# Patient Record
Sex: Male | Born: 1940 | Race: White | Hispanic: No | Marital: Married
Health system: Southern US, Community
[De-identification: ages and names within clinical notes are randomized; demographics above are authoritative.]

## PROBLEM LIST (undated history)

## (undated) DIAGNOSIS — A419 Sepsis, unspecified organism: Secondary | ICD-10-CM

## (undated) DIAGNOSIS — I38 Endocarditis, valve unspecified: Secondary | ICD-10-CM

## (undated) DIAGNOSIS — T826XXA Infection and inflammatory reaction due to cardiac valve prosthesis, initial encounter: Secondary | ICD-10-CM

## (undated) DIAGNOSIS — R6520 Severe sepsis without septic shock: Secondary | ICD-10-CM

## (undated) DIAGNOSIS — J9621 Acute and chronic respiratory failure with hypoxia: Secondary | ICD-10-CM

## (undated) DIAGNOSIS — I482 Chronic atrial fibrillation, unspecified: Secondary | ICD-10-CM

## (undated) DIAGNOSIS — J449 Chronic obstructive pulmonary disease, unspecified: Secondary | ICD-10-CM

## (undated) HISTORY — DX: Endocarditis, valve unspecified: I38

## (undated) HISTORY — DX: Sepsis, unspecified organism: R65.20

## (undated) HISTORY — DX: Acute and chronic respiratory failure with hypoxia: J96.21

## (undated) HISTORY — DX: Chronic atrial fibrillation, unspecified: I48.20

## (undated) HISTORY — DX: Infection and inflammatory reaction due to cardiac valve prosthesis, initial encounter: T82.6XXA

## (undated) HISTORY — DX: Sepsis, unspecified organism: A41.9

## (undated) HISTORY — DX: Chronic obstructive pulmonary disease, unspecified: J44.9

---

## 2019-05-28 ENCOUNTER — Other Ambulatory Visit: Payer: Self-pay | Admitting: Internal Medicine

## 2019-05-28 DIAGNOSIS — A419 Sepsis, unspecified organism: Secondary | ICD-10-CM

## 2019-05-28 DIAGNOSIS — J449 Chronic obstructive pulmonary disease, unspecified: Secondary | ICD-10-CM

## 2019-05-28 DIAGNOSIS — T826XXS Infection and inflammatory reaction due to cardiac valve prosthesis, sequela: Secondary | ICD-10-CM

## 2019-05-28 DIAGNOSIS — I38 Endocarditis, valve unspecified: Secondary | ICD-10-CM

## 2019-05-28 DIAGNOSIS — I482 Chronic atrial fibrillation, unspecified: Secondary | ICD-10-CM | POA: Diagnosis not present

## 2019-05-28 DIAGNOSIS — J9621 Acute and chronic respiratory failure with hypoxia: Secondary | ICD-10-CM | POA: Diagnosis not present

## 2019-05-28 MED ORDER — APIXABAN 5 MG PO TABS
5.00 | ORAL_TABLET | ORAL | Status: DC
Start: 2019-05-27 — End: 2019-05-28

## 2019-05-28 MED ORDER — LINEZOLID 100 MG/5ML PO SUSR
600.00 | ORAL | Status: DC
Start: 2019-05-27 — End: 2019-05-28

## 2019-05-28 MED ORDER — TAMSULOSIN HCL 0.4 MG PO CAPS
0.40 | ORAL_CAPSULE | ORAL | Status: DC
Start: 2019-05-28 — End: 2019-05-28

## 2019-05-28 MED ORDER — LORAZEPAM 1 MG PO TABS
0.50 | ORAL_TABLET | ORAL | Status: DC
Start: ? — End: 2019-05-28

## 2019-05-28 MED ORDER — ARFORMOTEROL TARTRATE 15 MCG/2ML IN NEBU
15.00 | INHALATION_SOLUTION | RESPIRATORY_TRACT | Status: DC
Start: 2019-05-27 — End: 2019-05-28

## 2019-05-28 MED ORDER — DEXTROSE 50 % IV SOLN
25.00 | INTRAVENOUS | Status: DC
Start: ? — End: 2019-05-28

## 2019-05-28 MED ORDER — ACETAMINOPHEN 160 MG/5ML PO SOLN
650.00 | ORAL | Status: DC
Start: ? — End: 2019-05-28

## 2019-05-28 MED ORDER — QUETIAPINE FUMARATE 25 MG PO TABS
25.00 | ORAL_TABLET | ORAL | Status: DC
Start: 2019-05-27 — End: 2019-05-28

## 2019-05-28 MED ORDER — IPRATROPIUM-ALBUTEROL 0.5-2.5 (3) MG/3ML IN SOLN
3.00 | RESPIRATORY_TRACT | Status: DC
Start: ? — End: 2019-05-28

## 2019-05-28 MED ORDER — OXYCODONE HCL 5 MG PO TABS
5.00 | ORAL_TABLET | ORAL | Status: DC
Start: ? — End: 2019-05-28

## 2019-05-28 MED ORDER — INSULIN REGULAR HUMAN 100 UNIT/ML IJ SOLN
0.00 | INTRAMUSCULAR | Status: DC
Start: 2019-05-27 — End: 2019-05-28

## 2019-05-28 MED ORDER — AMIODARONE HCL 200 MG PO TABS
200.00 | ORAL_TABLET | ORAL | Status: DC
Start: 2019-05-28 — End: 2019-05-28

## 2019-05-28 MED ORDER — BUDESONIDE 0.5 MG/2ML IN SUSP
0.50 | RESPIRATORY_TRACT | Status: DC
Start: 2019-05-27 — End: 2019-05-28

## 2019-05-28 MED ORDER — ASPIRIN 81 MG PO CHEW
81.00 | CHEWABLE_TABLET | ORAL | Status: DC
Start: 2019-05-28 — End: 2019-05-28

## 2019-05-28 MED ORDER — MELATONIN 3 MG PO TABS
3.00 | ORAL_TABLET | ORAL | Status: DC
Start: 2019-05-27 — End: 2019-05-28

## 2019-05-28 MED ORDER — FUROSEMIDE 20 MG PO TABS
20.00 | ORAL_TABLET | ORAL | Status: DC
Start: 2019-05-27 — End: 2019-05-28

## 2019-05-28 MED ORDER — ATORVASTATIN CALCIUM 10 MG PO TABS
10.00 | ORAL_TABLET | ORAL | Status: DC
Start: 2019-05-27 — End: 2019-05-28

## 2019-05-28 MED ORDER — CHLORHEXIDINE GLUCONATE 0.12 % MT SOLN
15.00 | OROMUCOSAL | Status: DC
Start: 2019-05-27 — End: 2019-05-28

## 2019-05-28 MED ORDER — LANSOPRAZOLE 30 MG PO TBDD
30.00 | DELAYED_RELEASE_TABLET | ORAL | Status: DC
Start: 2019-05-27 — End: 2019-05-28

## 2019-05-28 MED ORDER — LOPERAMIDE HCL 1 MG/7.5ML PO LIQD
2.00 | ORAL | Status: DC
Start: ? — End: 2019-05-28

## 2019-05-28 MED ORDER — ONDANSETRON HCL 4 MG/2ML IJ SOLN
4.00 | INTRAMUSCULAR | Status: DC
Start: ? — End: 2019-05-28

## 2019-05-28 NOTE — Progress Notes (Signed)
[facility]  Date of Service: 05/28/2019  PULMONARY CONSULT   Troy Day  ZOX:[id number]  DOB: 01/31/1941     Referring Physician: Larena GlassmanAmir Firozvi, MD  HPI: Troy Day is a 78 y.o. male seen for Acute on Chronic Respiratory Failure.  Patient is a very complex patient with a past medical history significant for aortic valve replacement atrial fibrillation COPD hyperlipidemia none sustained ventricular tachycardia hypertension GERD aortic aneurysm with repair hyperlipidemia prostate cancer who [facility] and sepsis.  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  Patient was admitted to the hospital for IV antibiotics and CT surgery also did see the patient.  Patient was taken to the OR because of persistent positive cultures and underwent a sternotomy and redo AVR along with the ascending aortic root replacement.  Complicated postoperatively course patient apparently developed a abdominal pain CT was done which showed segmental bowel thickening surgery consulted and saw the patient with a benign exam refused repeat CTA was done and it showed some distal occlusion and vascular surgery was consulted.  Patient underwent exploration had a SMA embolectomy done with vascular and general surgery evaluating the patient.  His bowels were left open with a wound VAC placed.  Also had sepsis and required pressors at that time.  The patient subsequently failed to come off of the ventilator and ended up with a tracheostomy.  Patient apparently was also taken back to the OR for reexploration and abdominal closure.  Apparently the family had [facility] patient's family achieved a virtual consultation and they expressed that there would be a possibility of  surgical intervention.  Patient was subsequently extubated and required BiPAP status however worsened and ended up being reintubated and at this point tracheostomy was done.  Transferred to our facility for further management and weaning.  Review of Systems:  ROS performed and is unremarkable other than noted above.  Past Medical History: Past Medical History:  Diagnosis Date  . Aortic stenosis 10/2018  Bioprosthetic AV replacment  . Atrial fibrillation with RVR (CMS/HCC) 10/24/2018  . COPD (chronic obstructive pulmonary disease) (CMS/HCC)  USES INHALER  . Dyslipidemia 03/15/2014  . Dysrhythmia  NSVT  . Essential hypertension 03/15/2014  NO BP MEDS - CONTROLLED  . GERD (gastroesophageal reflux disease)  . H/O coronary angioplasty  STENT  . Heart imaging 09/30/2018  ECHO - EF 50-55%  . Heart murmur  . History of aortic aneurysm repair 10/2018  [place] Procedure  . History of tobacco abuse 03/15/2014  . Hyperlipidemia  . Immunizations up to date  . Paroxysmal atrial fibrillation (CMS/HCC) 10/31/2018  . Prostate cancer (CMS/HCC)  15 YEARS AGO - NO CHEMO OR RADIATION   Past Surgical History:  Procedure Laterality Date  . CARDIAC CATHETERIZATION 08/13/2018  . CARDIAC VALVE REPLACEMENT 10/15/2018  AVR  . CORONARIES LHC WITH OR WITHOUT LV N/A 03/30/2016  Procedure: Coronaries LHC with or without LV; Surgeon: Berneice HeinrichFrances Oliver Wood, MD; [facility]; Service: Cardiovascular; Laterality: N/A;  . CORONARIES RHC - NO LHC 08/13/2018  Procedure: Coronaries RHC - No LHC; Surgeon: Rozelle LoganJack Watson Noneman Jr., MD; [facility]; Service: Cardiovascular;;  . CORONARY STENT PLACEMENT  x 2 RCA, LAD  . PROSTATECTOMY  15 years ago - no chemo or radiation  . REPAIR OF ASCENDING  AORTA AND HEMIARCH 10/15/2018  . SUPRAVALVULAR AORTOGRAPHY - ADD ON 08/13/2018  Procedure: Supravalvular Aortography - Add On; Surgeon: Rozelle Logan., MD; Location: [facility] INVASIVE  CARDIOLOGY; Service: Cardiovascular;;     Family History: Non-Contributory to the present illness  Allergies  Reviewed on the [facility]  Medications: Reviewed on Rounds  Physical Exam:  Vitals: Temperature 97.7 pulse 98 respiratory 26 blood pressure 124/60 saturations 95%  Ventilator Settings mode ventilation pressure support FiO2 50% tidal line 358 pressure poor 12 PEEP 7  . General: Comfortable at this time . Eyes: Grossly normal lids, irises & conjunctiva . ENT: grossly tongue is normal . Neck: no obvious mass . Cardiovascular: S1-S2 normal no gallop or rub is noted at this time . Respiratory: No rhonchi no rales are noted . Abdomen: Soft and nontender . Skin: no rash seen on limited exam . Musculoskeletal: not rigid . Psychiatric:unable to assess . Neurologic: no seizure no involuntary movements         Labs on Admission:  White count 4.5 hemoglobin 7.9 hematocrit 28.0 platelet count 225 Sodium 139 potassium 4.6 BUN 17 creatinine 0.4  Radiological Exams on Admission: *EXAM: Chest One View  INDICATION: Respiratory Arrest/Failure.  TECHNIQUE: Single frontal view of the chest.  COMPARISON: None  FINDINGS/IMPRESSION:  Limited by underpenetration, portable technique and low lung volumes.  Tracheostomy tube with tip at the clavicular heads. Median sternotomy wires. Probable mild-to-moderate cardiomegaly. Residual oral contrast in left colon.  Hazy opacities at the costophrenic angles which may relate to small bilateral pleural effusions, left greater than right.  Moderate diffuse heterogeneous pulmonary opacities are nonspecific and could relate to pulmonary edema and/or pneumonia including atypical pneumonias. Other infiltrative pulmonary process not excluded.     Electronically Signed by: Gracy Bruins, MD, [facility] Radiology Electronically Signed on: 05/27/2019 10:10 PM     Patient:   Troy Day, Troy Day   Med Rec#:   [id number]        DOB;  Age:   [birth year] 78y yr.  Proc Date:  05/26/2019       Account#:   [id number]       Height:    177 cm / 69.7 in Accession#:  [id number]     Weight:    88.4 kg / 194.8 lbs Sex:     M           BSA:     2.06 Pt. Type:   Inpatient Exam Loc:   Portable  Reading:   Thornton Papas, MD Referring:  Altamese Cabal Sonographer: Brennan Bailey ______________________________________________________________________  Transthoracic Echocardiogram  CPT Code(s): * Echocardiogram Comp with Color Flow Doppler and Spectral Doppler (93306)  Indication: CHF / Follow up AVR vegetation Rhythm:    Sinus BP:      115/81 HR:      91  Summary:  1. The left ventricle mass index and the relative wall thickness values indicate concentric hypertrophy. 2. The EF is estimated at 50-55%. 3. The left ventricular diastolic filling pattern is consistent with elevated mean left atrial pressure. 4. The right ventricular global systolic function is mild to moderately reduced.  5. The aortic valve is not well visualized. 6. A bio-prosthetic aortic valve is present. The valve is poorly seen but there is a significant echodensity adjacent or part of the valve structure. TEE suggested. It is difficult to tell if the mild AI is paravalvular or valvular. 7. The peak gradient across the aortic valve is . 8. The mean gradient across the aortic valve  is 62mmHg. 9. Mild aortic regurgitation is present. 10. There is mild mitral regurgitation observed.   Findings     Technical Comments: The study quality is technically difficult.    Left Ventricle: The left ventricular chamber size is normal. The left ventricle mass index and the relative wall thickness values indicate concentric hypertrophy. There is normal left ventricular systolic function. There is global hypokinesis of the left ventricle with minor regional variation.  The EF is estimated at 50-55%. The left ventricular diastolic filling pattern is consistent with elevated mean left atrial pressure.   Left Atrium: The left atrial chamber size is normal.   Right Ventricle: The right ventricle is not well visualized. The right ventricular global systolic function is mild to moderately reduced.    Right Atrium: The right atrium is not well visualized. The right atrium is normal.   Aortic Valve: The aortic valve is not well visualized. Mild aortic regurgitation is present. The peak gradient across the aortic valve is 74mmHg. The mean gradient across the aortic valve is 81mmHg. A bio-prosthetic aortic valve is present.   Mitral Valve: The mitral valve leaflets appear normal. There is mild mitral regurgitation observed.   Tricuspid Valve: The tricuspid valve leaflets are morphologically normal. There is trivial tricuspid regurgitation.   Pulmonic Valve: The pulmonic valve is not well visualized. There is no evidence of pulmonic regurgitation.   Pericardium: The pericardium appears normal.   Aorta: The aortic root appears normal.   Venous: The inferior vena cava is dilated.    Miscellaneous: No evidence of thrombus, intra-cardiac shunting or vegetation.  Measurements  Chambers Name             Value     Normal Range    IVSd (2D)          1.7 cm    (0.6 - 1.1)    LVIDd (2D)          4.5 cm    -          LVIDs (2D)          3.5 cm    (2.2 - 4)     LVPWd (2D)          1.9 cm    (0.6 - 1.1)    Ao root diameter (2D)    3.5 cm    -          Ascending Ao         3.4 cm    -          LA dimension 2D       5.2 cm    -          LA ESV SP 4CH (MOD)     44.4 ml    -          LA ESV SP 2CH (MOD)     71.7 ml    -          LA ESV BP (MOD)        57.6 ml    -          LA ESV BP (MOD) index    28 ml/m2   (16 - 28)     LV FS (Teichholz) (2D)    22.2 %    (20 - 80)      Aortic Valve Name             Value     Normal Range  AV Vmax           2.9 m/sec   (<2.5)       AV peak gradient       34 mmHg    (<36)       AV mean gradient       18 mmHg    (<20)       AV VTI            54 cm     -           Mitral Valve Name             Value     Normal Range    MV E-wave Vmax        1.33 m/sec  -          MV A-wave Vmax        1.22 m/sec  -          MV E:A ratio         1.1 ratio   (1.1 - 1.5)    LV E:e' lateral ratio    13 ratio   -          LV E:e' septal ratio     40.8 ratio  -          MV PHT            49 msec    -          MVA (PHT)          4.49 cm2   (4 - 6)       Volumes Name             Value     Normal Range    EF Teichholz (2D)      44.9 %    -           Electronically signed by: Thornton Papas, MD on 05/26/2019 14:55:16  Assessment/Plan Patient Active Problem List   Diagnosis Date Noted  . Acute on chronic respiratory failure with hypoxia (HCC)   . Chronic atrial fibrillation (HCC)   . Endocarditis of prosthetic valve (HCC)   . Severe sepsis (HCC)   . COPD, severe (HCC)      1. Acute on chronic respiratory failure hypoxia patient had come to [place] off the ventilator however patient decompensated almost immediately on arrival and to be placed back on the ventilator now we have him back on pressure support and is requiring 50% FiO2.  He has significant cardiopulmonary disease which is going to make it actually quite difficult for [place] to completely be able to liberate this patient off the ventilator.  In addition to that she has the  underlying valvular issues which are going on.  Echocardiogram results are noted as above.  This is going to make his overall prognosis guarded 2. Atrial fibrillation patient has been on amiodarone as well as apixaban the patient has obvious cardiac reasons for the atrial fibrillation and his rate will need to be monitored closely. 3. 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 4. Severe sepsis we are going to monitor him closely right now he is afebrile and appears to be comfortable. 5. COPD nebulizers as deemed necessary  I have personally seen and evaluated  the patient, evaluated laboratory and imaging results, formulated the assessment and plan and placed orders.  Patient is critically ill in danger of cardiac arrest and death The Patient requires high complexity decision making for assessment and support.  Case was discussed on Rounds with the Respiratory Therapy Staff Time Spent  Yevonne Pax, MD [facility] Pulmonary Critical Care Medicine [facility]

## 2019-05-30 ENCOUNTER — Other Ambulatory Visit: Payer: Self-pay | Admitting: Internal Medicine

## 2019-05-30 DIAGNOSIS — I482 Chronic atrial fibrillation, unspecified: Secondary | ICD-10-CM | POA: Diagnosis not present

## 2019-05-30 DIAGNOSIS — A419 Sepsis, unspecified organism: Secondary | ICD-10-CM

## 2019-05-30 DIAGNOSIS — J449 Chronic obstructive pulmonary disease, unspecified: Secondary | ICD-10-CM

## 2019-05-30 DIAGNOSIS — T826XXS Infection and inflammatory reaction due to cardiac valve prosthesis, sequela: Secondary | ICD-10-CM | POA: Diagnosis not present

## 2019-05-30 DIAGNOSIS — J9621 Acute and chronic respiratory failure with hypoxia: Secondary | ICD-10-CM | POA: Diagnosis not present

## 2019-05-30 DIAGNOSIS — I38 Endocarditis, valve unspecified: Secondary | ICD-10-CM

## 2019-05-30 DIAGNOSIS — R6520 Severe sepsis without septic shock: Secondary | ICD-10-CM

## 2019-05-30 NOTE — Progress Notes (Signed)
[address] NOTE  PULMONARY SERVICE ROUNDS  Date of Service: 05/30/2019  Troy Day  DOB: [birth year]  Referring physician: Deanne Coffer, MD  HPI: Troy Day is a 78 y.o. male  being seen for Acute on Chronic Respiratory Failure.  He is awake appears to be comfortable he remains on the ventilator this morning was on pressure support and was on 50% FiO2 with a decent tidal volume.  Review of Systems: Unremarkable other than noted in HPI  Allergies:  Reviewed on the [facility]  Medications: Reviewed  Vitals: Temperature 97.2 pulse 82 respiratory rate 22 blood pressure 111/72 saturations 100%  Ventilator Settings: Mode of ventilation pressure support FiO2 is 50% tidal line 398 pressure poor 14 PEEP 7  Physical Exam: . General:  calm and comfortable NAD . Eyes: normal lids, irises & conjunctiva . ENT: grossly normal tongue not enlarged . Neck: no masses . Cardiovascular: S1 S2 Normal no rubs no gallop . Respiratory: Coarse breath sounds with some scattered rhonchi . Abdomen: soft non-distended . Skin: no rash seen on limited exam . Musculoskeletal:  no rigidity . Psychiatric: unable to assess . Neurologic: no involuntary movements          Lab Data and radiological Data:  Sodium 140 potassium 4.5 BUN 22 creatinine 0.4 White count 4.5 hemoglobin is 7.4 hematocrit 26.1 platelet count 171   Assessment/Plan  Patient Active Problem List   Diagnosis Date Noted  . Acute on chronic respiratory failure with hypoxia ([place])   . Chronic atrial fibrillation ([place])   . Endocarditis of prosthetic valve ([place])   . Severe sepsis ([place])   . COPD, severe ([place])       1. Acute on chronic respiratory failure with hypoxia patient continues on pressure support mode not weaning yet respiratory therapy will assess the RSB I and try to wean once again.  He is looking a little bit better so hopefully we can try again on T collar. 2. Chronic atrial fibrillation  rate is controlled patient has been on amiodarone and apixaban we will continue to monitor for any tachycardia and also for any bleeding. 3. Endocarditis of prosthetic valve.  Not a surgical candidate as per [facility] we will continue with on supportive care antibiotics infectious disease recommendations 4. Severe sepsis resolved 5. Severe COPD nebulizers as needed   I have personally evaluated the patient, evaluated the laboratory and imaging results and formulated the assessment and plan and placed orders as needed.  Time 35 minutes discussed with the primary care team and our team on rounds The Patient requires high complexity decision making for assessment and support. I have discussed the patient on rounds with the Respiratory Staff   Allyne Gee, MD [facility] Pulmonary Critical Care Medicine   This note is for inpatient care

## 2019-05-31 ENCOUNTER — Other Ambulatory Visit: Payer: Self-pay | Admitting: Internal Medicine

## 2019-05-31 DIAGNOSIS — A419 Sepsis, unspecified organism: Secondary | ICD-10-CM

## 2019-05-31 DIAGNOSIS — T826XXS Infection and inflammatory reaction due to cardiac valve prosthesis, sequela: Secondary | ICD-10-CM

## 2019-05-31 DIAGNOSIS — J449 Chronic obstructive pulmonary disease, unspecified: Secondary | ICD-10-CM

## 2019-05-31 DIAGNOSIS — I482 Chronic atrial fibrillation, unspecified: Secondary | ICD-10-CM

## 2019-05-31 DIAGNOSIS — I38 Endocarditis, valve unspecified: Secondary | ICD-10-CM

## 2019-05-31 DIAGNOSIS — J9621 Acute and chronic respiratory failure with hypoxia: Secondary | ICD-10-CM | POA: Diagnosis not present

## 2019-05-31 NOTE — Progress Notes (Signed)
Troy Day NOTE  PULMONARY SERVICE ROUNDS  Date of Service: 05/31/2019  Troy Day  DOB: [birth year]  Referring physician: Deanne Coffer, MD  HPI: Troy Day is a 78 y.o. male  being seen for Acute on Chronic Respiratory Failure.  He is awake comfortable remains on pressure support this morning was on 45% FiO2 without any distress  Review of Systems: Unremarkable other than noted in HPI  Allergies:  Reviewed on the [facility]  Medications: Reviewed  Vitals: Temperature 96.5 pulse 86 respiratory 24 blood pressure 130/78 saturations 99%  Ventilator Settings: Mode of ventilation pressure support FiO2 45% tidal line 310 PEEP 7 pressure support 14  Physical Exam: . General:  calm and comfortable NAD . Eyes: normal lids, irises & conjunctiva . ENT: grossly normal tongue not enlarged . Neck: no masses . Cardiovascular: S1 S2 Normal no rubs no gallop . Respiratory: No rhonchi no rales are noted at this time . Abdomen: soft non-distended . Skin: no rash seen on limited exam . Musculoskeletal:  no rigidity . Psychiatric: unable to assess . Neurologic: no involuntary movements          Lab Data and radiological Data:  No labs today   Assessment/Plan  Patient Active Problem List   Diagnosis Date Noted  . Acute on chronic respiratory failure with hypoxia ([place])   . Chronic atrial fibrillation ([place])   . Endocarditis of prosthetic valve ([place])   . Severe sepsis ([place])   . COPD, severe ([place])       1. Acute on chronic respiratory failure hypoxia plan is to continue with weaning on pressure support as tolerated patient currently is on 14/7 which could go down further to 12/7 see how he goes with the lower pressures.  Discussed with respiratory team on rounds 2. Chronic atrial fibrillation rate is controlled this time continue with amiodarone and apixaban 3. Endocarditis treated antibiotics will need prolonged course not a surgical  candidate 4. Severe sepsis resolved hemodynamics are stable we will continue to monitor 5. Severe COPD nebulizer therapy as tolerated continue to monitor closely   I have personally evaluated the patient, evaluated the laboratory and imaging results and formulated the assessment and plan and placed orders as needed.  Time 35 minutes discussion on rounds with the respiratory team and primary team The Patient requires high complexity decision making for assessment and support. I have discussed the patient on rounds with the Respiratory Staff   Allyne Gee, MD [facility] Pulmonary Critical Care Medicine   This note is for inpatient care

## 2019-06-01 ENCOUNTER — Encounter: Payer: Self-pay | Admitting: Internal Medicine

## 2019-06-01 ENCOUNTER — Other Ambulatory Visit: Payer: Self-pay | Admitting: Internal Medicine

## 2019-06-01 DIAGNOSIS — A419 Sepsis, unspecified organism: Secondary | ICD-10-CM | POA: Insufficient documentation

## 2019-06-01 DIAGNOSIS — I38 Endocarditis, valve unspecified: Secondary | ICD-10-CM | POA: Insufficient documentation

## 2019-06-01 DIAGNOSIS — J9621 Acute and chronic respiratory failure with hypoxia: Secondary | ICD-10-CM

## 2019-06-01 DIAGNOSIS — I482 Chronic atrial fibrillation, unspecified: Secondary | ICD-10-CM | POA: Diagnosis not present

## 2019-06-01 DIAGNOSIS — T826XXS Infection and inflammatory reaction due to cardiac valve prosthesis, sequela: Secondary | ICD-10-CM | POA: Diagnosis not present

## 2019-06-01 DIAGNOSIS — J449 Chronic obstructive pulmonary disease, unspecified: Secondary | ICD-10-CM

## 2019-06-01 DIAGNOSIS — R6520 Severe sepsis without septic shock: Secondary | ICD-10-CM | POA: Insufficient documentation

## 2019-06-01 DIAGNOSIS — T826XXA Infection and inflammatory reaction due to cardiac valve prosthesis, initial encounter: Secondary | ICD-10-CM | POA: Insufficient documentation

## 2019-06-01 NOTE — Progress Notes (Signed)
[place] NOTE  PULMONARY SERVICE ROUNDS  Date of Service: 06/01/2019  Gaylen Venning  DOB: 09-14-1940  Referring physician: Deanne Coffer, MD  HPI: Troy Day is a 78 y.o. male  being seen for Acute on Chronic Respiratory Failure.  Patient currently is on pressure support mode with an FiO2 of 45% patient has been on a PEEP of 7 on pressure support of 14 spoke with respiratory therapy on rounds and has not really seen any major improvement  Review of Systems: Unremarkable other than noted in HPI  Allergies:  Reviewed on the [facility]  Medications: Reviewed  Vitals: Temperature 96.5 pulse 84 respiratory rate 20 blood pressure 120/60 saturations 100%  Ventilator Settings: Mode of ventilation pressure support FiO2 45% tidal volume 383 PEEP 7 pressure support 14  Physical Exam: . General:  calm and comfortable NAD . Eyes: normal lids, irises & conjunctiva . ENT: grossly normal tongue not enlarged . Neck: no masses . Cardiovascular: S1 S2 Normal no rubs no gallop . Respiratory: No rhonchi no rales are noted at this time . Abdomen: soft non-distended . Skin: no rash seen on limited exam . Musculoskeletal:  no rigidity . Psychiatric: unable to assess . Neurologic: no involuntary movements          Lab Data and radiological Data:  Sodium 141 potassium 4.5 BUN 22 creatinine 0.5 White count 5.5 hemoglobin 6.6 hematocrit 23.5 platelet count 179   Assessment/Plan  Patient Active Problem List   Diagnosis Date Noted  . Acute on chronic respiratory failure with hypoxia ([place])   . Chronic atrial fibrillation ([place])   . Endocarditis of prosthetic valve ([place])   . Severe sepsis ([place])   . COPD, severe ([place])       1. Acute on chronic respiratory failure with hypoxia patient's hemoglobin is down to 6.6 not tolerating weaning today with respiratory therapy reassess perhaps after he receives blood we will continue with 45% FiO2 currently on pressure  support 14/7 2. Chronic atrial fibrillation rate is controlled patient has been on anticoagulation low hemoglobin discussed with primary care team regarding respiratory status. 3. Endocarditis treated antibiotics we will continue with supportive care 4. Severe sepsis hemodynamics are stable we will continue to monitor 5. Severe COPD at baseline continue present therapy   I have personally evaluated the patient, evaluated the laboratory and imaging results and formulated the assessment and plan and placed orders as needed. The Patient requires high complexity decision making for assessment and support. I have discussed the patient on rounds with the Respiratory Staff   Allyne Gee, MD [facility] Pulmonary Critical Care Medicine   This note is for inpatient care

## 2019-06-02 ENCOUNTER — Other Ambulatory Visit (HOSPITAL_COMMUNITY): Payer: Medicare Other | Admitting: Internal Medicine

## 2019-06-02 DIAGNOSIS — I482 Chronic atrial fibrillation, unspecified: Secondary | ICD-10-CM

## 2019-06-02 DIAGNOSIS — J9621 Acute and chronic respiratory failure with hypoxia: Secondary | ICD-10-CM | POA: Diagnosis not present

## 2019-06-02 DIAGNOSIS — T826XXS Infection and inflammatory reaction due to cardiac valve prosthesis, sequela: Secondary | ICD-10-CM | POA: Diagnosis not present

## 2019-06-02 DIAGNOSIS — R6520 Severe sepsis without septic shock: Secondary | ICD-10-CM

## 2019-06-02 DIAGNOSIS — I38 Endocarditis, valve unspecified: Secondary | ICD-10-CM

## 2019-06-02 DIAGNOSIS — A419 Sepsis, unspecified organism: Secondary | ICD-10-CM

## 2019-06-02 DIAGNOSIS — J449 Chronic obstructive pulmonary disease, unspecified: Secondary | ICD-10-CM

## 2019-06-02 NOTE — Progress Notes (Signed)
Troy Day  PULMONARY SERVICE ROUNDS  Date of Service: 06/02/2019  Troy Day  DOB: 10/02/1940  Referring physician: Deanne Coffer, MD  HPI: Troy Day is a 78 y.o. male  being seen for Acute on Chronic Respiratory Failure.  Patient is on pressure support mode at this time remains comfortable has been on 45% FiO2 currently is requiring per support of 14 and a PEEP of 5 which was increased up to 7 for unclear reasons  Review of Systems: Unremarkable other than noted in HPI  Allergies:  Reviewed on the [facility]  Medications: Reviewed  Vitals: Temperature 97.3 pulse 95 respiratory 22 blood pressure 130/66 saturations 96%  Ventilator Settings: Mode of ventilation pressure support FiO2 is 45% tidal volume 350 pressure 114 PEEP 7  Physical Exam: . General:  calm and comfortable NAD . Eyes: normal lids, irises & conjunctiva . ENT: grossly normal tongue not enlarged . Neck: no masses . Cardiovascular: S1 S2 Normal no rubs no gallop . Respiratory: Coarse rhonchi expansion is equal . Abdomen: soft non-distended . Skin: no rash seen on limited exam . Musculoskeletal:  no rigidity . Psychiatric: unable to assess . Neurologic: no involuntary movements          Lab Data and radiological Data:  White count 5.4 hemoglobin 7.9 hematocrit 26 platelet count 160   Assessment/Plan  Patient Active Problem List   Diagnosis Date Noted  . Acute on chronic respiratory failure with hypoxia ([place])   . Chronic atrial fibrillation ([place])   . Endocarditis of prosthetic valve ([place])   . Severe sepsis ([place])   . COPD, severe ([place])       1. Acute on chronic respiratory failure with hypoxia patient will be continued on pressure support mode currently currently is on 45% FiO2 and a PEEP of 7 pressure support 14.  Saturations were 96% I asked for respiratory therapy to wean the PEEP down if possible. 2. Chronic atrial fibrillation rate controlled at this  time plan is to continue with present management. 3. Endocarditis supportive care prognosis guarded not a surgical candidate 4. Severe sepsis resolved 5. Severe COPD nebulizer therapy as needed continue with present management.   I have personally evaluated the patient, evaluated the laboratory and imaging results and formulated the assessment and plan and placed orders as needed. The Patient requires high complexity decision making for assessment and support. Rounds were done with the Respiratory Therapy Director and respiratory therapist involved in the care of the patient as well as nursing staff.   Allyne Gee, MD [facility] Pulmonary Critical Care Medicine   [name] is for inpatient care

## 2019-06-03 ENCOUNTER — Other Ambulatory Visit (HOSPITAL_COMMUNITY): Payer: Medicare Other | Admitting: Internal Medicine

## 2019-06-03 DIAGNOSIS — J9621 Acute and chronic respiratory failure with hypoxia: Secondary | ICD-10-CM

## 2019-06-03 DIAGNOSIS — J449 Chronic obstructive pulmonary disease, unspecified: Secondary | ICD-10-CM

## 2019-06-03 DIAGNOSIS — A419 Sepsis, unspecified organism: Secondary | ICD-10-CM

## 2019-06-03 DIAGNOSIS — I482 Chronic atrial fibrillation, unspecified: Secondary | ICD-10-CM | POA: Diagnosis not present

## 2019-06-03 DIAGNOSIS — T826XXS Infection and inflammatory reaction due to cardiac valve prosthesis, sequela: Secondary | ICD-10-CM

## 2019-06-03 DIAGNOSIS — R6520 Severe sepsis without septic shock: Secondary | ICD-10-CM

## 2019-06-03 DIAGNOSIS — I38 Endocarditis, valve unspecified: Secondary | ICD-10-CM

## 2019-06-03 NOTE — Progress Notes (Signed)
[place] NOTE  PULMONARY SERVICE ROUNDS  Date of Service: 06/03/2019  Troy Day  DOB: 08-29-1940  Referring physician: Deanne Coffer, MD  HPI: Troy Day is a 78 y.o. male  being seen for Acute on Chronic Respiratory Failure.  Patient currently is on pressure support mode his numbers have been looking good he should be able to advance on weaning  Review of Systems: Unremarkable other than noted in HPI  Allergies:  Reviewed on the [facility]  Medications: Reviewed  Vitals: Temperature 98.9 pulse 86 respiratory 26 blood pressure 112/56 saturations 96%  Ventilator Settings: Mode of ventilation pressure support FiO2 45% tidal volume 362 per support 18 PEEP is 8  Physical Exam: . General:  calm and comfortable NAD . Eyes: normal lids, irises & conjunctiva . ENT: grossly normal tongue not enlarged . Neck: no masses . Cardiovascular: S1 S2 Normal no rubs no gallop . Respiratory: Coarse breath sounds with a few scattered rhonchi noted . Abdomen: soft non-distended . Skin: no rash seen on limited exam . Musculoskeletal:  no rigidity . Psychiatric: unable to assess . Neurologic: no involuntary movements          Lab Data and radiological Data:  Sodium 142 potassium 3.7 BUN 25 creatinine 0.6   Assessment/Plan  Patient Active Problem List   Diagnosis Date Noted  . Acute on chronic respiratory failure with hypoxia ([place])   . Chronic atrial fibrillation ([place])   . 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 Severe sepsis ([place])   . COPD, severe ([place])       1. Acute on chronic respiratory failure with hypoxia the plan is to continue normal pressure support at rest and advance to capping trials as tolerated. 2. Chronic atrial fibrillation rate controlled 3. Endocarditis treated still on antibiotics 4. Severe sepsis continue with antibiotics 5. Severe COPD at baseline   I have personally evaluated the patient, evaluated the laboratory  and imaging results and formulated the assessment and plan and placed orders as needed. The Patient requires high complexity decision making for assessment and support. Rounds were done with the Respiratory Therapy Director and respiratory therapist involved in the care of the patient as well as nursing staff.   Allyne Gee, MD [facility] Pulmonary Critical Care Medicine   This note is for inpatient care

## 2019-06-04 ENCOUNTER — Other Ambulatory Visit (HOSPITAL_COMMUNITY): Payer: Medicare Other | Admitting: Internal Medicine

## 2019-06-04 DIAGNOSIS — J9621 Acute and chronic respiratory failure with hypoxia: Secondary | ICD-10-CM | POA: Diagnosis not present

## 2019-06-04 DIAGNOSIS — T826XXS Infection and inflammatory reaction due to cardiac valve prosthesis, sequela: Secondary | ICD-10-CM | POA: Diagnosis not present

## 2019-06-04 DIAGNOSIS — J449 Chronic obstructive pulmonary disease, unspecified: Secondary | ICD-10-CM | POA: Diagnosis not present

## 2019-06-04 DIAGNOSIS — I482 Chronic atrial fibrillation, unspecified: Secondary | ICD-10-CM | POA: Diagnosis not present

## 2019-06-04 DIAGNOSIS — R6520 Severe sepsis without septic shock: Secondary | ICD-10-CM

## 2019-06-04 DIAGNOSIS — A419 Sepsis, unspecified organism: Secondary | ICD-10-CM

## 2019-06-04 DIAGNOSIS — I38 Endocarditis, valve unspecified: Secondary | ICD-10-CM

## 2019-06-04 NOTE — Progress Notes (Signed)
[place] NOTE  PULMONARY SERVICE ROUNDS  Date of Service: 06/04/2019  Troy Day  DOB: 19-Sep-1940  Referring physician: Deanne Coffer, MD  HPI: Troy Day is a 78 y.o. male  being seen for Acute on Chronic Respiratory Failure.  Patient is on pressure support mode currently on 45% FiO2 with a PEEP of 8 should be able to advance the weaning and I would like to try him on T collar.  Patient was apparently doing T collar prior to admission.  Review of Systems: Unremarkable other than noted in HPI  Allergies:  Reviewed on the [facility]  Medications: Reviewed  Vitals: Temperature 96.7 pulse 80 respiratory 25 blood pressure is 113/60 saturations 98%  Ventilator Settings: Mode of ventilation pressure support FiO2 is 45% pressure 16 PEEP 8 tidal volume 471  Physical Exam: . General:  calm and comfortable NAD . Eyes: normal lids, irises & conjunctiva . ENT: grossly normal tongue not enlarged . Neck: no masses . Cardiovascular: S1 S2 Normal no rubs no gallop . Respiratory: No rhonchi coarse breath sounds . Abdomen: soft non-distended . Skin: no rash seen on limited exam . Musculoskeletal:  no rigidity . Psychiatric: unable to assess . Neurologic: no involuntary movements          Lab Data and radiological Data:  No labs to report today   Assessment/Plan  Patient Active Problem List   Diagnosis Date Noted  . Acute on chronic respiratory failure with hypoxia ([place])   . Chronic atrial fibrillation ([place])   . 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 Severe sepsis ([place])   . COPD, severe ([place])       1. Acute on chronic respiratory failure with hypoxia patient will be continued on pressure support ventilation 45% FiO2 with a PEEP of 8 2. Chronic atrial fibrillation rate controlled we will continue with present management 3. Endocarditis treated improving 4. Severe sepsis with an x-ray improved 5. Severe COPD nebulizers as  necessary   I have personally evaluated the patient, evaluated the laboratory and imaging results and formulated the assessment and plan and placed orders as needed. The Patient requires high complexity decision making for assessment and support. Rounds were done with the Respiratory Therapy Director and respiratory therapist involved in the care of the patient as well as nursing staff.   Allyne Gee, MD [facility] Pulmonary Critical Care Medicine   This note is for inpatient care

## 2019-06-06 ENCOUNTER — Other Ambulatory Visit (HOSPITAL_COMMUNITY): Payer: Medicare Other | Admitting: Internal Medicine

## 2019-06-06 DIAGNOSIS — R6520 Severe sepsis without septic shock: Secondary | ICD-10-CM

## 2019-06-06 DIAGNOSIS — I38 Endocarditis, valve unspecified: Secondary | ICD-10-CM

## 2019-06-06 DIAGNOSIS — T826XXS Infection and inflammatory reaction due to cardiac valve prosthesis, sequela: Secondary | ICD-10-CM | POA: Diagnosis not present

## 2019-06-06 DIAGNOSIS — J449 Chronic obstructive pulmonary disease, unspecified: Secondary | ICD-10-CM

## 2019-06-06 DIAGNOSIS — I482 Chronic atrial fibrillation, unspecified: Secondary | ICD-10-CM | POA: Diagnosis not present

## 2019-06-06 DIAGNOSIS — J9621 Acute and chronic respiratory failure with hypoxia: Secondary | ICD-10-CM | POA: Diagnosis not present

## 2019-06-06 DIAGNOSIS — A419 Sepsis, unspecified organism: Secondary | ICD-10-CM

## 2019-06-06 NOTE — Progress Notes (Signed)
[facility]  PROGRESS NOTE  PULMONARY SERVICE ROUNDS  Date of Service: 06/06/2019  Troy Day  DOB: Aug 02, 1940  Referring physician: Larena Glassman, MD  HPI: Troy Day is a 79 y.o. male  being seen for Acute on Chronic Respiratory Failure.  Patient currently is on pressure support mode has been on 40% FiO2 good tidal volumes are noted  Review of Systems: Unremarkable other than noted in HPI  Allergies:  Reviewed on the [facility]  Medications: Reviewed  Vitals: Temperature 98.1 pulse 80 respiratory rate is 20 blood pressure 108/74 saturations 100%  Ventilator Settings: Mode of ventilation pressure support FiO2 40% tidal volume 479 pressure 114 PEEP 6  Physical Exam: . General:  calm and comfortable NAD . Eyes: normal lids, irises & conjunctiva . ENT: grossly normal tongue not enlarged . Neck: no masses . Cardiovascular: S1 S2 Normal no rubs no gallop . Respiratory: No rhonchi no rales are noted at this time . Abdomen: soft non-distended . Skin: no rash seen on limited exam . Musculoskeletal:  no rigidity . Psychiatric: unable to assess . Neurologic: no involuntary movements          Lab Data and radiological Data:  No labs today   Assessment/Plan  Patient Active Problem List   Diagnosis Date Noted  . Acute on chronic respiratory failure with hypoxia (HCC)   . Chronic atrial fibrillation (HCC)   . Endocarditis of prosthetic valve (HCC)   . Severe sepsis (HCC)   . COPD, severe (HCC)       1. Acute on chronic respiratory failure with hypoxia continue with pressure support mode patient currently is on 40% FiO2 14/6 however respiratory therapy try to decrease the support little bit more 2. Chronic atrial fibrillation rate is controlled at this time 3. Endocarditis at baseline 4. Severe sepsis hemodynamics are stable 5. Severe COPD nebulizers as needed   I have personally evaluated the patient, evaluated the laboratory and imaging  results and formulated the assessment and plan and placed orders as needed. The Patient requires high complexity decision making for assessment and support. Rounds were done with the Respiratory Therapy Director and respiratory therapist involved in the care of the patient as well as nursing staff.   Yevonne Pax, MD [facility] Pulmonary Critical Care Medicine   This note is for inpatient care

## 2019-06-07 ENCOUNTER — Other Ambulatory Visit (HOSPITAL_COMMUNITY): Payer: Medicare Other | Admitting: Internal Medicine

## 2019-06-07 DIAGNOSIS — I482 Chronic atrial fibrillation, unspecified: Secondary | ICD-10-CM | POA: Diagnosis not present

## 2019-06-07 DIAGNOSIS — J449 Chronic obstructive pulmonary disease, unspecified: Secondary | ICD-10-CM

## 2019-06-07 DIAGNOSIS — T826XXS Infection and inflammatory reaction due to cardiac valve prosthesis, sequela: Secondary | ICD-10-CM | POA: Diagnosis not present

## 2019-06-07 DIAGNOSIS — R6520 Severe sepsis without septic shock: Secondary | ICD-10-CM

## 2019-06-07 DIAGNOSIS — A419 Sepsis, unspecified organism: Secondary | ICD-10-CM

## 2019-06-07 DIAGNOSIS — J9621 Acute and chronic respiratory failure with hypoxia: Secondary | ICD-10-CM | POA: Diagnosis not present

## 2019-06-07 DIAGNOSIS — I38 Endocarditis, valve unspecified: Secondary | ICD-10-CM

## 2019-06-07 NOTE — Progress Notes (Signed)
[facility]  PROGRESS NOTE  PULMONARY SERVICE ROUNDS  Date of Service: 06/07/2019  Troy Day  DOB: 1941-03-19  Referring physician: Larena Glassman, MD  HPI: Troy Day is a 79 y.o. male  being seen for Acute on Chronic Respiratory Failure.  Patient has been failing spontaneous breathing trials is back on the ventilator on pressure control mode currently on 45% FiO2  Review of Systems: Unremarkable other than noted in HPI  Allergies:  Reviewed on the [facility]  Medications: Reviewed  Vitals: Temperature 96.8 pulse 94 respiratory 20 blood pressure 126/74 saturations 99%  Ventilator Settings: Mode of ventilation pressure assist control FiO2 40% tidal volume 359 inspiratory pressure 18 PEEP 8  Physical Exam: . General:  calm and comfortable NAD . Eyes: normal lids, irises & conjunctiva . ENT: grossly normal tongue not enlarged . Neck: no masses . Cardiovascular: S1 S2 Normal no rubs no gallop . Respiratory: Coarse breath sounds with few scattered rhonchi . Abdomen: soft non-distended . Skin: no rash seen on limited exam . Musculoskeletal:  no rigidity . Psychiatric: unable to assess . Neurologic: no involuntary movements          Lab Data and radiological Data:  No labs to report today   Assessment/Plan  Patient Active Problem List   Diagnosis Date Noted  . Acute on chronic respiratory failure with hypoxia (HCC)   . Chronic atrial fibrillation (HCC)   . Endocarditis of prosthetic valve (HCC)   . Severe sepsis (HCC)   . COPD, severe (HCC)       1. Acute on chronic respiratory failure hypoxia plan is to continue with full support on pressure control mode patient's been requiring 45% FiO2 RSB I is being checked which the patient is consistently failing 2. Chronic atrial fibrillation rate is controlled we will continue with supportive care 3. Endocarditis on antibiotics 4. Severe sepsis hemodynamics are stable 5. Severe COPD nebulizers as  needed   I have personally evaluated the patient, evaluated the laboratory and imaging results and formulated the assessment and plan and placed orders as needed. The Patient requires high complexity decision making with multiple system involvement. Rounds were done with the Respiratory Therapy Director and respiratory therapist involved in the care of the patient as well as nursing staff.   Yevonne Pax, MD [facility] Pulmonary Critical Care Medicine   This note is for inpatient care

## 2019-06-14 ENCOUNTER — Other Ambulatory Visit (HOSPITAL_COMMUNITY): Payer: Medicare Other | Admitting: Internal Medicine

## 2019-06-14 DIAGNOSIS — R6520 Severe sepsis without septic shock: Secondary | ICD-10-CM

## 2019-06-14 DIAGNOSIS — I482 Chronic atrial fibrillation, unspecified: Secondary | ICD-10-CM | POA: Diagnosis not present

## 2019-06-14 DIAGNOSIS — J9621 Acute and chronic respiratory failure with hypoxia: Secondary | ICD-10-CM

## 2019-06-14 DIAGNOSIS — A419 Sepsis, unspecified organism: Secondary | ICD-10-CM

## 2019-06-14 DIAGNOSIS — T826XXS Infection and inflammatory reaction due to cardiac valve prosthesis, sequela: Secondary | ICD-10-CM

## 2019-06-14 DIAGNOSIS — J449 Chronic obstructive pulmonary disease, unspecified: Secondary | ICD-10-CM | POA: Diagnosis not present

## 2019-06-14 DIAGNOSIS — I38 Endocarditis, valve unspecified: Secondary | ICD-10-CM

## 2019-06-14 NOTE — Progress Notes (Signed)
[facility]  PROGRESS NOTE  PULMONARY SERVICE ROUNDS  Date of Service: 06/14/2019  Troy Day  DOB: 02-15-1941  Referring physician: Larena Glassman, MD  HPI: Troy Day is a 79 y.o. male  being seen for Acute on Chronic Respiratory Failure.  Patient currently is on full support and pressure assist control mode has been on 45% FiO2 with good saturations  Review of Systems: Unremarkable other than noted in HPI  Allergies:  Reviewed on the [facility]  Medications: Reviewed  Vitals: Temperature 97.7 pulse 87 respiratory 24 blood pressure is 100/57 saturations 96%  Ventilator Settings: Mode of ventilation pressure assist control FiO2 45% tidal line 341 PEEP 6  Physical Exam: . General:  calm and comfortable NAD . Eyes: normal lids, irises & conjunctiva . ENT: grossly normal tongue not enlarged . Neck: no masses . Cardiovascular: S1 S2 Normal no rubs no gallop . Respiratory: No rhonchi no rales are noted . Abdomen: soft non-distended . Skin: no rash seen on limited exam . Musculoskeletal:  no rigidity . Psychiatric: unable to assess . Neurologic: no involuntary movements          Lab Data and radiological Data:  Sodium 145 potassium 4.1 BUN 36 creatinine 0.6 White count 5.9 hemoglobin 8.3 hematocrit 27.1 platelet count 162   Assessment/Plan  Patient Active Problem List   Diagnosis Date Noted  . Acute on chronic respiratory failure with hypoxia (HCC)   . Chronic atrial fibrillation (HCC)   . Endocarditis of prosthetic valve (HCC)   . Severe sepsis (HCC)   . COPD, severe (HCC)       1. Acute on chronic respiratory failure with hypoxia the plan is to continue with full support on the ventilator he is not tolerating weaning attempts 2. Chronic atrial fibrillation rate controlled we will continue to follow 3. Endocarditis treated supportive care prognosis guarded 4. Severe sepsis hemodynamics are stable 5. COPD severe disease we will continue  to monitor closely.   I have personally evaluated the patient, evaluated the laboratory and imaging results and formulated the assessment and plan and placed orders as needed. The Patient requires high complexity decision making with multiple system involvement. Rounds were done with the Respiratory Therapy Director and respiratory therapist involved in the care of the patient as well as nursing staff.   Yevonne Pax, MD [facility] Pulmonary Critical Care Medicine   This note is for inpatient care

## 2019-06-15 ENCOUNTER — Other Ambulatory Visit (HOSPITAL_COMMUNITY): Payer: Medicare Other | Admitting: Internal Medicine

## 2019-06-15 DIAGNOSIS — R6520 Severe sepsis without septic shock: Secondary | ICD-10-CM

## 2019-06-15 DIAGNOSIS — T826XXS Infection and inflammatory reaction due to cardiac valve prosthesis, sequela: Secondary | ICD-10-CM

## 2019-06-15 DIAGNOSIS — A419 Sepsis, unspecified organism: Secondary | ICD-10-CM

## 2019-06-15 DIAGNOSIS — I482 Chronic atrial fibrillation, unspecified: Secondary | ICD-10-CM | POA: Diagnosis not present

## 2019-06-15 DIAGNOSIS — J9621 Acute and chronic respiratory failure with hypoxia: Secondary | ICD-10-CM

## 2019-06-15 DIAGNOSIS — J449 Chronic obstructive pulmonary disease, unspecified: Secondary | ICD-10-CM | POA: Diagnosis not present

## 2019-06-15 DIAGNOSIS — I38 Endocarditis, valve unspecified: Secondary | ICD-10-CM

## 2019-06-15 NOTE — Progress Notes (Signed)
[facility]  PROGRESS NOTE  PULMONARY SERVICE ROUNDS  Date of Service: 06/15/2019  Kimari Coudriet  DOB: Nov 24, 1940  Referring physician: Larena Glassman, MD  HPI: Troy Day is a 79 y.o. male  being seen for Acute on Chronic Respiratory Failure.  Patient currently is on full support on the ventilator has not been tolerating weaning right now is on pressure control mode currently on 45% FiO2 with a PEEP of 6  Review of Systems: Unremarkable other than noted in HPI  Allergies:  Reviewed on the [facility]  Medications: Reviewed  Vitals: Temperature 97.7 pulse 87 respiratory 28 blood pressure is 109/61 saturations 97%  Ventilator Settings: Mode ventilation pressure assist control FiO2 45% tidal volume 416 PEEP 6 inspiratory pressure 16  Physical Exam: . General:  calm and comfortable NAD . Eyes: normal lids, irises & conjunctiva . ENT: grossly normal tongue not enlarged . Neck: no masses . Cardiovascular: S1 S2 Normal no rubs no gallop . Respiratory: No rhonchi coarse breath sounds are noted at this time . Abdomen: soft non-distended . Skin: no rash seen on limited exam . Musculoskeletal:  no rigidity . Psychiatric: unable to assess . Neurologic: no involuntary movements          Lab Data and radiological Data:  Sodium 148 potassium 3.9 BUN 33 creatinine 0.6 White count 4.8 hemoglobin seven-point hematocrit 26.2 platelet count 156   Assessment/Plan  Patient Active Problem List   Diagnosis Date Noted  . Acute on chronic respiratory failure with hypoxia (HCC)   . Chronic atrial fibrillation (HCC)   . Endocarditis of prosthetic valve (HCC)   . Severe sepsis (HCC)   . COPD, severe (HCC)       1. Acute on chronic respiratory failure hypoxia plan is to continue full vent support patient has been consistently failing attempts at weaning 2. Chronic atrial fibrillation rate controlled 3. Endocarditis of prosthetic valve on antibiotics 4. Severe sepsis  right now hemodynamics are stable 5. Severe COPD at baseline   I have personally evaluated the patient, evaluated the laboratory and imaging results and formulated the assessment and plan and placed orders as needed. The Patient requires high complexity decision making with multiple system involvement. Rounds were done with the Respiratory Therapy Director and respiratory therapist involved in the care of the patient as well as nursing staff.   Yevonne Pax, MD [facility] Pulmonary Critical Care Medicine   This note is for inpatient care

## 2019-06-19 ENCOUNTER — Other Ambulatory Visit (HOSPITAL_COMMUNITY): Payer: Medicare Other | Admitting: Internal Medicine

## 2019-06-19 DIAGNOSIS — T826XXS Infection and inflammatory reaction due to cardiac valve prosthesis, sequela: Secondary | ICD-10-CM | POA: Diagnosis not present

## 2019-06-19 DIAGNOSIS — J9621 Acute and chronic respiratory failure with hypoxia: Secondary | ICD-10-CM

## 2019-06-19 DIAGNOSIS — J449 Chronic obstructive pulmonary disease, unspecified: Secondary | ICD-10-CM

## 2019-06-19 DIAGNOSIS — I38 Endocarditis, valve unspecified: Secondary | ICD-10-CM

## 2019-06-19 DIAGNOSIS — R6520 Severe sepsis without septic shock: Secondary | ICD-10-CM

## 2019-06-19 DIAGNOSIS — I482 Chronic atrial fibrillation, unspecified: Secondary | ICD-10-CM

## 2019-06-19 DIAGNOSIS — A419 Sepsis, unspecified organism: Secondary | ICD-10-CM

## 2019-06-19 NOTE — Progress Notes (Signed)
[facility]  PROGRESS NOTE  PULMONARY SERVICE ROUNDS  Date of Service: 06/19/2019  Troy Day  DOB: 06-02-1941  Referring physician: Larena Glassman, MD  HPI: Troy Day is a 79 y.o. male  being seen for Acute on Chronic Respiratory Failure.  Patient currently is on the ventilator and full support.  They are going to try and line PMV on him he has not been tolerating spontaneous breathing trial so therefore has not been weaning  Review of Systems: Unremarkable other than noted in HPI  Allergies:  Reviewed on the [facility]  Medications: Reviewed  Vitals: Temperature 96.4 pulse 85 respiratory rate 18 blood pressure is 126/62 saturations 98%  Ventilator Settings: Mode of ventilation pressure assist control FiO2 45% and story pressure 15 PEEP of 5  Physical Exam: . General:  calm and comfortable NAD . Eyes: normal lids, irises & conjunctiva . ENT: grossly normal tongue not enlarged . Neck: no masses . Cardiovascular: S1 S2 Normal no rubs no gallop . Respiratory: No rhonchi coarse breath sounds . Abdomen: soft non-distended . Skin: no rash seen on limited exam . Musculoskeletal:  no rigidity . Psychiatric: unable to assess . Neurologic: no involuntary movements          Lab Data and radiological Data:  Sodium 146 potassium 4.3 BUN 30 creatinine 0.5 White count 4.8 hemoglobin 7.8 hematocrit 27 platelet count 159   Assessment/Plan  Patient Active Problem List   Diagnosis Date Noted  . Acute on chronic respiratory failure with hypoxia (HCC)   . Chronic atrial fibrillation (HCC)   . Endocarditis of prosthetic valve (HCC)   . Severe sepsis (HCC)   . COPD, severe (HCC)       1. Acute on chronic respiratory failure with hypoxia plan continue with full support on the ventilator patient's not tolerating weaning attempts at this time.  Patient is also going to be attempted on the PMV inline 2. Chronic atrial fibrillation rate  controlled 3. Endocarditis treated on antibiotics 4. Severe sepsis resolved hemodynamics stable 5. Severe COPD at baseline   I have personally evaluated the patient, evaluated the laboratory and imaging results and formulated the assessment and plan and placed orders as needed. The Patient requires high complexity decision making with multiple system involvement. Rounds were done with the Respiratory Therapy Director and respiratory therapist involved in the care of the patient as well as nursing staff.   Yevonne Pax, MD [facility] Pulmonary Critical Care Medicine   This note is for inpatient care

## 2019-06-20 ENCOUNTER — Other Ambulatory Visit (HOSPITAL_COMMUNITY): Payer: Medicare Other | Admitting: Internal Medicine

## 2019-06-20 DIAGNOSIS — T826XXS Infection and inflammatory reaction due to cardiac valve prosthesis, sequela: Secondary | ICD-10-CM | POA: Diagnosis not present

## 2019-06-20 DIAGNOSIS — R6520 Severe sepsis without septic shock: Secondary | ICD-10-CM

## 2019-06-20 DIAGNOSIS — J9621 Acute and chronic respiratory failure with hypoxia: Secondary | ICD-10-CM

## 2019-06-20 DIAGNOSIS — I38 Endocarditis, valve unspecified: Secondary | ICD-10-CM

## 2019-06-20 DIAGNOSIS — A419 Sepsis, unspecified organism: Secondary | ICD-10-CM

## 2019-06-20 DIAGNOSIS — J449 Chronic obstructive pulmonary disease, unspecified: Secondary | ICD-10-CM

## 2019-06-20 DIAGNOSIS — I482 Chronic atrial fibrillation, unspecified: Secondary | ICD-10-CM | POA: Diagnosis not present

## 2019-06-20 NOTE — Progress Notes (Signed)
[facility]  PROGRESS NOTE  PULMONARY SERVICE ROUNDS  Date of Service: 06/20/2019  Troy Day  DOB: Jul 22, 1940  Referring physician: Larena Glassman, MD  HPI: Troy Day is a 79 y.o. male  being seen for Acute on Chronic Respiratory Failure.  Patient is on full support right now on pressure control mode has been on 45% FiO2  Review of Systems: Unremarkable other than noted in HPI  Allergies:  Reviewed on the [facility]  Medications: Reviewed  Vitals: Temperature 96.7 pulse 88 respiratory rate 18 blood pressure 128/64 saturations 100%  Ventilator Settings: Mode of ventilation pressure assist control FiO2 45% 1.13 PEEP 5  Physical Exam: . General:  calm and comfortable NAD . Eyes: normal lids, irises & conjunctiva . ENT: grossly normal tongue not enlarged . Neck: no masses . Cardiovascular: S1 S2 Normal no rubs no gallop . Respiratory: No rhonchi no rales are noted at this time . Abdomen: soft non-distended . Skin: no rash seen on limited exam . Musculoskeletal:  no rigidity . Psychiatric: unable to assess . Neurologic: no involuntary movements          Lab Data and radiological Data:  Sodium 145 potassium 3.9 BUN 31 creatinine 0.5 White count 4.9 hemoglobin 6.8 hematocrit 22.8 platelet count 175   Assessment/Plan  Patient Active Problem List   Diagnosis Date Noted  . Acute on chronic respiratory failure with hypoxia (HCC)   . Chronic atrial fibrillation (HCC)   . Endocarditis of prosthetic valve (HCC)   . Severe sepsis (HCC)   . COPD, severe (HCC)       1. Acute on chronic respiratory failure hypoxia right now on full support not tolerating any weaning attempts.  Patient's hemoglobin is also low may need to have blood transfusion well defer to primary care team. 2. Chronic atrial fibrillation rate is controlled 3. Endocarditis treated patient's overall prognosis quite guarded 4. Severe sepsis hemodynamics are stable resolved 5. Severe  COPD management   I have personally evaluated the patient, evaluated the laboratory and imaging results and formulated the assessment and plan and placed orders as needed. The Patient requires high complexity decision making with multiple system involvement. Rounds were done with the Respiratory Therapy Director and respiratory therapist involved in the care of the patient as well as nursing staff.   Yevonne Pax, MD [facility] Pulmonary Critical Care Medicine   This note is for inpatient care

## 2019-06-21 ENCOUNTER — Other Ambulatory Visit (HOSPITAL_COMMUNITY): Payer: Medicare Other | Admitting: Internal Medicine

## 2019-06-21 DIAGNOSIS — J449 Chronic obstructive pulmonary disease, unspecified: Secondary | ICD-10-CM | POA: Diagnosis not present

## 2019-06-21 DIAGNOSIS — I482 Chronic atrial fibrillation, unspecified: Secondary | ICD-10-CM

## 2019-06-21 DIAGNOSIS — J9621 Acute and chronic respiratory failure with hypoxia: Secondary | ICD-10-CM | POA: Diagnosis not present

## 2019-06-21 DIAGNOSIS — I38 Endocarditis, valve unspecified: Secondary | ICD-10-CM

## 2019-06-21 DIAGNOSIS — T826XXS Infection and inflammatory reaction due to cardiac valve prosthesis, sequela: Secondary | ICD-10-CM | POA: Diagnosis not present

## 2019-06-21 DIAGNOSIS — R6520 Severe sepsis without septic shock: Secondary | ICD-10-CM

## 2019-06-21 DIAGNOSIS — A419 Sepsis, unspecified organism: Secondary | ICD-10-CM

## 2019-06-21 NOTE — Progress Notes (Signed)
[facility]  PROGRESS NOTE  PULMONARY SERVICE ROUNDS  Date of Service: 06/21/2019  Troy Day  DOB: Jul 17, 1940  Referring physician: Larena Glassman, MD  HPI: Troy Day is a 79 y.o. male  being seen for Acute on Chronic Respiratory Failure.  Patient is on full support right now on pressure control mode has been on 45% FiO2  Review of Systems: Unremarkable other than noted in HPI  Allergies:  Reviewed on the [facility]  Medications: Reviewed  Vitals: Temperature 97.2 pulse 77 respiratory 18 blood pressure is 108/63 saturations 99%  Ventilator Settings: Mode of ventilation pressure assist control FiO2 45% tidal volume 425 PEEP 7  Physical Exam: . General:  calm and comfortable NAD . Eyes: normal lids, irises & conjunctiva . ENT: grossly normal tongue not enlarged . Neck: no masses . Cardiovascular: S1 S2 Normal no rubs no gallop . Respiratory: No rhonchi no rales are noted at this time . Abdomen: soft non-distended . Skin: no rash seen on limited exam . Musculoskeletal:  no rigidity . Psychiatric: unable to assess . Neurologic: no involuntary movements          Lab Data and radiological Data:  No labs to report today   Assessment/Plan  Patient Active Problem List   Diagnosis Date Noted  . Acute on chronic respiratory failure with hypoxia (HCC)   . Chronic atrial fibrillation (HCC)   . Endocarditis of prosthetic valve (HCC)   . Severe sepsis (HCC)   . COPD, severe (HCC)       1. Acute on chronic respiratory failure with hypoxia plan is to continue with full support on the ventilator his RSB I has been poor patient is not able to tolerate weaning. 2. Chronic atrial fibrillation right now rate controlled 3. Endocarditis treated with antibiotics prognosis guarded 4. Severe sepsis hemodynamics are stable resolved 5. Severe COPD patient is at baseline we will continue with present management   I have personally evaluated the patient,  evaluated the laboratory and imaging results and formulated the assessment and plan and placed orders as needed. The Patient requires high complexity decision making with multiple system involvement. Rounds were done with the Respiratory Therapy Director and respiratory therapist involved in the care of the patient as well as nursing staff.   Yevonne Pax, MD [facility] Pulmonary Critical Care Medicine   This note is for inpatient care

## 2019-06-22 ENCOUNTER — Other Ambulatory Visit (HOSPITAL_COMMUNITY): Payer: Medicare Other | Admitting: Internal Medicine

## 2019-06-22 DIAGNOSIS — A419 Sepsis, unspecified organism: Secondary | ICD-10-CM

## 2019-06-22 DIAGNOSIS — T826XXS Infection and inflammatory reaction due to cardiac valve prosthesis, sequela: Secondary | ICD-10-CM

## 2019-06-22 DIAGNOSIS — R6520 Severe sepsis without septic shock: Secondary | ICD-10-CM

## 2019-06-22 DIAGNOSIS — I482 Chronic atrial fibrillation, unspecified: Secondary | ICD-10-CM | POA: Diagnosis not present

## 2019-06-22 DIAGNOSIS — J449 Chronic obstructive pulmonary disease, unspecified: Secondary | ICD-10-CM | POA: Diagnosis not present

## 2019-06-22 DIAGNOSIS — J9621 Acute and chronic respiratory failure with hypoxia: Secondary | ICD-10-CM | POA: Diagnosis not present

## 2019-06-22 DIAGNOSIS — I38 Endocarditis, valve unspecified: Secondary | ICD-10-CM

## 2019-06-22 NOTE — Progress Notes (Signed)
[facility]  PROGRESS NOTE  PULMONARY SERVICE ROUNDS  Date of Service: 06/22/2019  Troy Day  DOB: [birth year]  Referring physician: Larena Glassman, MD  HPI: Troy Day is a 79 y.o. male  being seen for Acute on Chronic Respiratory Failure.  Patient is currently on the ventilator on full support is on pressure control mode right now has been on 40% FiO2  Review of Systems: Unremarkable other than noted in HPI  Allergies:  Reviewed on the [facility]  Medications: Reviewed  Vitals: Temperature 97.3 pulse 82 respiratory 28 blood pressure is 124/80 saturations 99%  Ventilator Settings: Mode of ventilation pressure assist control FiO2 40% tidal lines were 72 PEEP 7  Physical Exam: . General:  calm and comfortable NAD . Eyes: normal lids, irises & conjunctiva . ENT: grossly normal tongue not enlarged . Neck: no masses . Cardiovascular: S1 S2 Normal no rubs no gallop . Respiratory: No rhonchi no rales are noted at this time . Abdomen: soft non-distended . Skin: no rash seen on limited exam . Musculoskeletal:  no rigidity . Psychiatric: unable to assess . Neurologic: no involuntary movements          Lab Data and radiological Data:  Sodium 144 potassium 4.4 BUN 30 creatinine 0.6 White count 4.5 hemoglobin 7.2 hematocrit 23.7 platelet count 172   Assessment/Plan  Patient Active Problem List   Diagnosis Date Noted  . Acute on chronic respiratory failure with hypoxia (HCC)   . Chronic atrial fibrillation (HCC)   . Endocarditis of prosthetic valve (HCC)   . Severe sepsis (HCC)   . COPD, severe (HCC)       1. Acute on chronic respiratory failure with hypoxia plan continue with full support patient has been continuously failing weaning attempts.  We will continue to follow 2. Chronic atrial fibrillation rate controlled 3. Endocarditis on antibiotics supportive care 4. Severe sepsis hemodynamically stable 5. Severe COPD prognosis guarded we will  continue to monitor   I have personally evaluated the patient, evaluated the laboratory and imaging results and formulated the assessment and plan and placed orders as needed. The Patient requires high complexity decision making with multiple system involvement. Rounds were done with the Respiratory Therapy Director and respiratory therapist involved in the care of the patient as well as nursing staff.   Yevonne Pax, MD [facility] Pulmonary Critical Care Medicine   This note is for inpatient care

## 2019-06-23 ENCOUNTER — Other Ambulatory Visit (HOSPITAL_COMMUNITY): Payer: Medicare Other | Admitting: Internal Medicine

## 2019-06-23 DIAGNOSIS — R6520 Severe sepsis without septic shock: Secondary | ICD-10-CM

## 2019-06-23 DIAGNOSIS — J9621 Acute and chronic respiratory failure with hypoxia: Secondary | ICD-10-CM | POA: Diagnosis not present

## 2019-06-23 DIAGNOSIS — T826XXS Infection and inflammatory reaction due to cardiac valve prosthesis, sequela: Secondary | ICD-10-CM

## 2019-06-23 DIAGNOSIS — I482 Chronic atrial fibrillation, unspecified: Secondary | ICD-10-CM

## 2019-06-23 DIAGNOSIS — A419 Sepsis, unspecified organism: Secondary | ICD-10-CM

## 2019-06-23 DIAGNOSIS — J449 Chronic obstructive pulmonary disease, unspecified: Secondary | ICD-10-CM | POA: Diagnosis not present

## 2019-06-23 DIAGNOSIS — I38 Endocarditis, valve unspecified: Secondary | ICD-10-CM

## 2019-06-23 NOTE — Progress Notes (Signed)
[facility]  PROGRESS NOTE  PULMONARY SERVICE ROUNDS  Date of Service: 06/23/2019  Troy Day  DOB: [birth year]  Referring physician: Larena Glassman, MD  HPI: Troy Day is a 79 y.o. male  being seen for Acute on Chronic Respiratory Failure.  Patient is comfortable right now without distress at this time is on 40% FiO2 with pressure control   Review of Systems: Unremarkable other than noted in HPI  Allergies:  Reviewed on the [facility]  Medications: Reviewed  Vitals: Temperature 97 respiratory rate 14 pulse 80 blood pressure 110/60 saturations 99%  Ventilator Settings: Mode ventilation pressure assist control FiO2 40% tidal line 452 PEEP 7 expiratory pressure 20  Physical Exam: . General:  calm and comfortable NAD . Eyes: normal lids, irises & conjunctiva . ENT: grossly normal tongue not enlarged . Neck: no masses . Cardiovascular: S1 S2 Normal no rubs no gallop . Respiratory: No rhonchi no rales are noted at this time . Abdomen: soft non-distended . Skin: no rash seen on limited exam . Musculoskeletal:  no rigidity . Psychiatric: unable to assess . Neurologic: no involuntary movements          Lab Data and radiological Data:  White count 4.2 hemoglobin 7.4 hematocrit 24.4 platelet count 187   Assessment/Plan  Patient Active Problem List   Diagnosis Date Noted  . Acute on chronic respiratory failure with hypoxia (HCC)   . Chronic atrial fibrillation (HCC)   . Endocarditis of prosthetic valve (HCC)   . Severe sepsis (HCC)   . COPD, severe (HCC)       1. Acute on chronic respiratory failure with hypoxia plan is to continue with on full vent support patient is not tolerating weaning at all. 2. Chronic atrial fibrillation rate is controlled 3. Endocarditis at baseline we will continue with supportive care 4. Severe sepsis resolved 5. Severe COPD at baseline   I have personally evaluated the patient, evaluated the laboratory and  imaging results and formulated the assessment and plan and placed orders as needed. The Patient requires high complexity decision making with multiple system involvement. Rounds were done with the Respiratory Therapy Director and respiratory therapist involved in the care of the patient as well as nursing staff.   Yevonne Pax, MD [facility] Pulmonary Critical Care Medicine   This note is for inpatient care

## 2019-06-24 ENCOUNTER — Other Ambulatory Visit (HOSPITAL_COMMUNITY): Payer: Medicare Other | Admitting: Internal Medicine

## 2019-06-24 DIAGNOSIS — I482 Chronic atrial fibrillation, unspecified: Secondary | ICD-10-CM

## 2019-06-24 DIAGNOSIS — J449 Chronic obstructive pulmonary disease, unspecified: Secondary | ICD-10-CM | POA: Diagnosis not present

## 2019-06-24 DIAGNOSIS — R6520 Severe sepsis without septic shock: Secondary | ICD-10-CM

## 2019-06-24 DIAGNOSIS — A419 Sepsis, unspecified organism: Secondary | ICD-10-CM

## 2019-06-24 DIAGNOSIS — T826XXS Infection and inflammatory reaction due to cardiac valve prosthesis, sequela: Secondary | ICD-10-CM | POA: Diagnosis not present

## 2019-06-24 DIAGNOSIS — J9621 Acute and chronic respiratory failure with hypoxia: Secondary | ICD-10-CM | POA: Diagnosis not present

## 2019-06-24 DIAGNOSIS — I38 Endocarditis, valve unspecified: Secondary | ICD-10-CM

## 2019-06-24 NOTE — Progress Notes (Signed)
[facility]  PROGRESS NOTE  PULMONARY SERVICE ROUNDS  Date of Service: 06/24/2019  Troy Day  DOB: 1940/06/17  Referring physician: Larena Glassman, MD  HPI: Troy Day is a 79 y.o. male  being seen for Acute on Chronic Respiratory Failure.  Patient is comfortable right now without distress on the ventilator full support.  Had a CT scan of the chest done which reveals diffuse infiltrates  Review of Systems: Unremarkable other than noted in HPI  Allergies:  Reviewed on the [facility]  Medications: Reviewed  Vitals: Temperature is 96.0 pulse 78 respiratory rate 18 blood pressure 158 saturations 98%  Ventilator Settings: Mode of ventilation pressure assist control  Physical Exam: . General:  calm and comfortable NAD . Eyes: normal lids, irises & conjunctiva . ENT: grossly normal tongue not enlarged . Neck: no masses . Cardiovascular: S1 S2 Normal no rubs no gallop . Respiratory: No rhonchi no rales are noted at this time . Abdomen: soft non-distended . Skin: no rash seen on limited exam . Musculoskeletal:  no rigidity . Psychiatric: unable to assess . Neurologic: no involuntary movements          Lab Data and radiological Data:  White count 5.1 hemoglobin 7.4 hematocrit 24.2 platelet count 196   Assessment/Plan  Patient Active Problem List   Diagnosis Date Noted  . Acute on chronic respiratory failure with hypoxia (HCC)   . Chronic atrial fibrillation (HCC)   . Endocarditis of prosthetic valve (HCC)   . Severe sepsis (HCC)   . COPD, severe (HCC)       1. Acute on chronic respiratory failure with hypoxia plan is to continue with weaning attempts however patient has been failing significantly 2. Chronic atrial fibrillation rate now rate controlled continue supportive care 3. Endocarditis continue present management 4. Severe sepsis hemodynamics are stable resolved 5. Severe COPD at baseline we will continue to follow   I have personally  evaluated the patient, evaluated the laboratory and imaging results and formulated the assessment and plan and placed orders as needed. The Patient requires high complexity decision making with multiple system involvement. Rounds were done with the Respiratory Therapy Director and respiratory therapist involved in the care of the patient as well as nursing staff.   Yevonne Pax, MD [facility] Pulmonary Critical Care Medicine   This note is for inpatient care

## 2019-06-25 ENCOUNTER — Other Ambulatory Visit (HOSPITAL_COMMUNITY): Payer: Medicare Other | Admitting: Internal Medicine

## 2019-06-25 DIAGNOSIS — R6520 Severe sepsis without septic shock: Secondary | ICD-10-CM

## 2019-06-25 DIAGNOSIS — J449 Chronic obstructive pulmonary disease, unspecified: Secondary | ICD-10-CM | POA: Diagnosis not present

## 2019-06-25 DIAGNOSIS — T826XXS Infection and inflammatory reaction due to cardiac valve prosthesis, sequela: Secondary | ICD-10-CM

## 2019-06-25 DIAGNOSIS — I38 Endocarditis, valve unspecified: Secondary | ICD-10-CM

## 2019-06-25 DIAGNOSIS — I482 Chronic atrial fibrillation, unspecified: Secondary | ICD-10-CM

## 2019-06-25 DIAGNOSIS — A419 Sepsis, unspecified organism: Secondary | ICD-10-CM

## 2019-06-25 DIAGNOSIS — J9621 Acute and chronic respiratory failure with hypoxia: Secondary | ICD-10-CM | POA: Diagnosis not present

## 2019-06-25 NOTE — Progress Notes (Signed)
[facility]  PROGRESS NOTE  PULMONARY SERVICE ROUNDS  Date of Service: 06/25/2019  Troy Day  DOB: 1941-04-17  Referring physician: Larena Glassman, MD  HPI: Troy Day is a 79 y.o. male  being seen for Acute on Chronic Respiratory Failure.  Patient remains on the ventilator switched over to pressure support today looks like he is tolerating that however the CT scan was showing what appears to be most consistent with fluid overload heart failure.  Spoke with the primary care team they are going to diurese him which he appears to be responding to a very nicely  Review of Systems: Unremarkable other than noted in HPI  Allergies:  Reviewed on the [facility]  Medications: Reviewed  Vitals: Temperature 97.7 pulse 78 respiratory rate 30 blood pressure is 120/60 saturations 98%  Ventilator Settings: Mode of ventilation pressure support FiO2 40% pressure support is 18 PEEP 5 tidal volume is 420  Physical Exam: . General:  calm and comfortable NAD . Eyes: normal lids, irises & conjunctiva . ENT: grossly normal tongue not enlarged . Neck: no masses . Cardiovascular: S1 S2 Normal no rubs no gallop . Respiratory: No rhonchi no rales are noted at this time . Abdomen: soft non-distended . Skin: no rash seen on limited exam . Musculoskeletal:  no rigidity . Psychiatric: unable to assess . Neurologic: no involuntary movements          Lab Data and radiological Data:  Sodium 143 potassium 3.7 BUN 20 creatinine 0.6 White count 4.8 hemoglobin 7.3 hematocrit 24.4 platelet count 1.9   EXAM: CT OF CHEST, ABDOMEN AND PELVIS WITHOUT CONTRAST WITH MIPS  INDICATION: Clinical trial-research protocol, see free text, resp. failure, SBE, ILEUS, J96.01 Acute respiratory failure with hypoxia (CMS-HCC), J96.02 Acute respiratory failure with hypercapnia (CMS-HCC). No additional history provided.  TECHNIQUE: Serial axial images through the chest, abdomen and pelvis without  contrast. 3-D MIP reformats of the chest also obtained via computer workstation manipulation. Absence of intravenous and oral contrast does somewhat limit assessment of mediastinal structures, vasculature, solid abdominal organs and bowel loops. Dose reduction was obtained with Automatic Exposure Control (AEC) or, if AEC could not be utilized, by manual adjustment of the mA and/or kV according to patient size.  COMPARISON:  Chest x-ray 06/22/2019 and abdomen films 06/22/2019. No prior CT chest, abdomen and pelvis available for comparison at time of dictation.  FINDINGS: Study is somewhat limited by patient motion which may be involuntary. Subtle abnormalities may be obscured.  CT CHEST:  Tracheostomy tube with tip at the clavicular heads. Airways appear primarily patent.  Moderate bilateral pleural effusions, nonspecific. Moderate consolidation in bilateral lower lobes which is nonspecific and could relate to atelectasis and/or pneumonia.  Moderate diffuse groundglass appearance of lung parenchyma, nonspecific but could relate to pulmonary edema in the acute clinical setting. Atypical pneumonia including COVID-19 also would be within differential considerations.  Moderate cardiomegaly, nonspecific. Aortic valve replacement. Moderate atherosclerotic calcifications and/or stents involving coronary arteries.  Mildly enlarged mediastinal lymph nodes measuring up to 1.3 cm in short axis measurement, nonspecific and could relate to inflammatory/infectious process. Neoplastic infiltration not excluded.  Median sternotomy wires. 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 Mild degenerative changes in spine.  IMPRESSION (Chest): 1. Moderate bilateral pleural effusions. Mild-to-moderate airspace disease in the lower lobes may relate to atelectasis and/or pneumonia. 2. Moderate diffuse groundglass appearance of lung parenchyma, nonspecific but could relate to  pulmonary edema. Atypical pneumonia including COVID-19 also also within differential considerations. 3. Mild mediastinal  lymphadenopathy.    CT ABDOMEN AND PELVIS:  1.9 cm cyst in the right lobe of liver, incompletely characterized but statistically benign.  Subtle low density areas laterally in the spleen are nonspecific and could be artifactual (series 2 image 60). Other lesions not excluded but statistically benign in absence of known malignancy. Focal area of splenic infarction not entirely excluded.  Gallbladder, pancreas and adrenal glands appear unremarkable.  Kidneys and urinary bladder appear unremarkable.  Nonspecific mild amount of presacral edema.  Mild diffuse distention of both large and small bowel loops, nonspecific but could relate to an ileus.  Appendix is not discretely demonstrated. Negative for secondary CT findings of acute appendicitis.  Walls of the rectum appear mildly thickened which could relate to suboptimal distention. Mild inflammatory/infectious process or neoplastic infiltration not entirely excluded.  Surgical clips in the pelvis. Prostate gland appears to have been resected.  Mild atherosclerotic calcifications involving abdominal aorta and iliac arteries.  Mild subcutaneous edema involving abdominal and pelvic walls, nonspecific.  Gastrostomy tube with tip in region of the body of the stomach  Negative for free intraperitoneal air.  Mild wedging of multiple lumbar vertebrae, likely chronic in absence of recent trauma. Mild-to-moderate degenerative changes along the spine.  IMPRESSION (abdomen and pelvis ):  1. Diffuse distention of large and small bowel loops may relate to ileus. 2. Mild thick-walled appearance of the rectum could relate to suboptimal distention. Rectal wall thickening related to inflammatory/infectious process or neoplastic infiltration not excluded. Findings can be correlated with digital rectal exam and/or  colonoscopy as clinically warranted. 3. Additional incidental and/or chronic findings as above.    Electronically Signed by: Gracy Bruins, MD, [facility] Radiology Electronically Signed on: 06/24/2019 6:29 PM  Other Result Information  Interface, Rad Results In - 06/24/2019  6:30 PM EST EXAM: CT OF CHEST, ABDOMEN AND PELVIS WITHOUT CONTRAST WITH MIPS  INDICATION: Clinical trial-research protocol, see free text, resp. failure, SBE, ILEUS, J96.01 Acute respiratory failure with hypoxia (CMS-HCC), J96.02 Acute respiratory failure with hypercapnia (CMS-HCC). No additional history provided.  TECHNIQUE: Serial axial images through the chest, abdomen and pelvis without contrast. 3-D MIP reformats of the chest also obtained via computer workstation manipulation. Absence of intravenous and oral contrast does somewhat limit assessment of mediastinal structures, vasculature, solid abdominal organs and bowel loops. Dose reduction was obtained with Automatic Exposure Control (AEC) or, if AEC could not be utilized, by manual adjustment of the mA and/or kV according to patient size.  COMPARISON:   Chest x-ray 06/22/2019 and abdomen films 06/22/2019. No prior CT chest, abdomen and pelvis available for comparison at time of dictation.  FINDINGS: Study is somewhat limited by patient motion which may be involuntary.  Subtle abnormalities may be obscured.  CT CHEST:  Tracheostomy tube with tip at the clavicular heads. Airways appear primarily patent.  Moderate bilateral pleural effusions, nonspecific. Moderate consolidation in bilateral lower lobes which is nonspecific and could relate to atelectasis and/or pneumonia.  Moderate diffuse groundglass appearance of lung parenchyma, nonspecific but could relate to pulmonary edema in the acute clinical setting. Atypical pneumonia including COVID-19 also would be within differential considerations.  Moderate cardiomegaly, nonspecific. Aortic valve  replacement. Moderate atherosclerotic calcifications and/or stents involving coronary arteries.  Mildly enlarged mediastinal lymph nodes measuring up to 1.3 cm in short axis measurement, nonspecific and could relate to inflammatory/infectious process. Neoplastic infiltration not excluded.  Median sternotomy wires. 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 Mild degenerative changes in spine.  IMPRESSION (Chest): 1. Moderate bilateral pleural  effusions. Mild-to-moderate airspace disease in the lower lobes may relate to atelectasis and/or pneumonia. 2. Moderate diffuse groundglass appearance of lung parenchyma, nonspecific but could relate to pulmonary edema. Atypical pneumonia including COVID-19 also also within differential considerations. 3. Mild mediastinal lymphadenopathy.    CT ABDOMEN AND PELVIS:  1.9 cm cyst in the right lobe of liver, incompletely characterized but statistically benign.  Subtle low density areas laterally in the spleen are nonspecific and could be artifactual (series 2 image 60). Other lesions not excluded but statistically benign in absence of known malignancy. Focal area of splenic infarction not entirely excluded.  Gallbladder, pancreas and adrenal glands appear unremarkable.  Kidneys and urinary bladder appear unremarkable.  Nonspecific mild amount of presacral edema.  Mild diffuse distention of both large and small bowel loops, nonspecific but could relate to an ileus.  Appendix is not discretely demonstrated. Negative for secondary CT findings of acute appendicitis.  Walls of the rectum appear mildly thickened which could relate to suboptimal distention. Mild inflammatory/infectious process or neoplastic infiltration not entirely excluded.  Surgical clips in the pelvis. Prostate gland appears to have been resected.  Mild atherosclerotic calcifications involving abdominal aorta and iliac arteries.  Mild subcutaneous  edema involving abdominal and pelvic walls, nonspecific.  Gastrostomy tube with tip in region of the body of the stomach  Negative for free intraperitoneal air.  Mild wedging of multiple lumbar vertebrae, likely chronic in absence of recent trauma. Mild-to-moderate degenerative changes along the spine.  IMPRESSION (abdomen and pelvis ):  1. Diffuse distention of large and small bowel loops may relate to ileus. 2. Mild thick-walled appearance of the rectum could relate to suboptimal distention. Rectal wall thickening related to inflammatory/infectious process or neoplastic infiltration not excluded. Findings can be correlated with digital rectal exam and/or colonoscopy as clinically warranted. 3. Additional incidental and/or chronic findings as above.    Electronically Signed by:  Bethanie Dicker, MD, [facility] Radiology Electronically Signed on:  06/24/2019 6:29 PM      Assessment/Plan  Patient Active Problem List   Diagnosis Date Noted  . Acute on chronic respiratory failure with hypoxia ([place])   . Chronic atrial fibrillation ([place])   . Endocarditis of prosthetic valve ([place])   . Severe sepsis ([place])   . COPD, severe ([place])       1. Acute on chronic respiratory failure hypoxia plan is to continue with weaning attempts as hopefully diuresis will help with his fluid overload perhaps will improve ability to wean.  CT scan results as above shows basically diffuse groundglass opacities more of the fluid appearance as already noted it could be also blood 2. Try to diurese first 3. Chronic atrial fibrillation rate now rate is controlled we will continue supportive care 4. Endocarditis treated we will continue with supportive care monitor 5. Severe sepsis resolved 6. Severe COPD at baseline continue present management   I have personally evaluated the patient, evaluated the laboratory and imaging results and formulated the assessment and plan and placed orders as needed.  Time 35  minutes The Patient requires high complexity decision making with multiple system involvement. Rounds were done with the Respiratory Therapy Director and respiratory therapist involved in the care of the patient as well as nursing staff.   Allyne Gee, MD [facility] Pulmonary Critical Care Medicine   This note is for inpatient care

## 2019-06-26 ENCOUNTER — Other Ambulatory Visit (HOSPITAL_COMMUNITY): Payer: Medicare Other | Admitting: Internal Medicine

## 2019-06-26 DIAGNOSIS — J449 Chronic obstructive pulmonary disease, unspecified: Secondary | ICD-10-CM | POA: Diagnosis not present

## 2019-06-26 DIAGNOSIS — T826XXS Infection and inflammatory reaction due to cardiac valve prosthesis, sequela: Secondary | ICD-10-CM

## 2019-06-26 DIAGNOSIS — I482 Chronic atrial fibrillation, unspecified: Secondary | ICD-10-CM

## 2019-06-26 DIAGNOSIS — A419 Sepsis, unspecified organism: Secondary | ICD-10-CM

## 2019-06-26 DIAGNOSIS — J9621 Acute and chronic respiratory failure with hypoxia: Secondary | ICD-10-CM | POA: Diagnosis not present

## 2019-06-26 DIAGNOSIS — I38 Endocarditis, valve unspecified: Secondary | ICD-10-CM

## 2019-06-26 DIAGNOSIS — R6520 Severe sepsis without septic shock: Secondary | ICD-10-CM

## 2019-06-26 NOTE — Progress Notes (Signed)
[facility]  PROGRESS NOTE  PULMONARY SERVICE ROUNDS  Date of Service: 06/26/2019  Troy Day  DOB: Feb 22, 1941  Referring physician: Larena Glassman, MD  HPI: Troy Day is a 79 y.o. male  being seen for Acute on Chronic Respiratory Failure.  Patient is currently on T collar comfortable right now without distress at this time  Review of Systems: Unremarkable other than noted in HPI  Allergies:  Reviewed on the [facility]  Medications: Reviewed  Vitals: Temperature 96.7 pulse 80 respiratory rate 30 blood pressure is 120/60 saturations are 94%  Ventilator Settings: Off the ventilator on T collar  Physical Exam: . General:  calm and comfortable NAD . Eyes: normal lids, irises & conjunctiva . ENT: grossly normal tongue not enlarged . Neck: no masses . Cardiovascular: S1 S2 Normal no rubs no gallop . Respiratory: No rhonchi no rales noted at this time . Abdomen: soft non-distended . Skin: no rash seen on limited exam . Musculoskeletal:  no rigidity . Psychiatric: unable to assess . Neurologic: no involuntary movements          Lab Data and radiological Data:  Labs have been reviewed   Assessment/Plan  Patient Active Problem List   Diagnosis Date Noted  . Acute on chronic respiratory failure with hypoxia (HCC)   . Chronic atrial fibrillation (HCC)   . Endocarditis of prosthetic valve (HCC)   . Severe sepsis (HCC)   . COPD, severe (HCC)       1. Acute on chronic respiratory failure with hypoxia we are going to make an attempt at weaning on T collar as tolerated hopefully up to 8 to 12 hours 2. Chronic atrial fibrillation rate controlled we will continue to monitor 3. Endocarditis treated improving 4. Severe sepsis hemodynamics are stable we will continue to monitor pressures 5. Severe COPD we will continue with present therapy   I have personally evaluated the patient, evaluated the laboratory and imaging results and formulated the  assessment and plan and placed orders as needed. The Patient requires high complexity decision making with multiple system involvement. Rounds were done with the Respiratory Therapy Director and respiratory therapist involved in the care of the patient as well as nursing staff.   Troy Pax, MD [facility] Pulmonary Critical Care Medicine   This note is for inpatient care

## 2019-06-27 ENCOUNTER — Other Ambulatory Visit (HOSPITAL_COMMUNITY): Payer: Medicare Other | Admitting: Internal Medicine

## 2019-06-27 DIAGNOSIS — R6520 Severe sepsis without septic shock: Secondary | ICD-10-CM

## 2019-06-27 DIAGNOSIS — A419 Sepsis, unspecified organism: Secondary | ICD-10-CM

## 2019-06-27 DIAGNOSIS — T826XXS Infection and inflammatory reaction due to cardiac valve prosthesis, sequela: Secondary | ICD-10-CM | POA: Diagnosis not present

## 2019-06-27 DIAGNOSIS — I482 Chronic atrial fibrillation, unspecified: Secondary | ICD-10-CM

## 2019-06-27 DIAGNOSIS — J9621 Acute and chronic respiratory failure with hypoxia: Secondary | ICD-10-CM

## 2019-06-27 DIAGNOSIS — J449 Chronic obstructive pulmonary disease, unspecified: Secondary | ICD-10-CM

## 2019-06-27 DIAGNOSIS — I38 Endocarditis, valve unspecified: Secondary | ICD-10-CM

## 2019-06-27 NOTE — Progress Notes (Signed)
[facility]  PROGRESS NOTE  PULMONARY SERVICE ROUNDS  Date of Service: 06/27/2019  Troy Day  DOB: [birth year]  Referring physician: Larena Glassman, MD  HPI: Troy Day is a 79 y.o. male  being seen for Acute on Chronic Respiratory Failure.  Patient is on now pressure support mode has been on 40% FiO2 resting mode.  Yesterday patient was able to do 8 hours on T collar so today we are going to build on that and advance further beyond the 8 hours if possible.  Review of Systems: Unremarkable other than noted in HPI  Allergies:  Reviewed on the [facility]  Medications: Reviewed  Vitals: Temperature 98.3 pulse 74 respiratory 24 blood pressure 130/60 saturations 100%  Ventilator Settings: Mode of ventilation pressure support FiO2 40% pressure support 10/5 tidal line 352  Physical Exam: . General:  calm and comfortable NAD . Eyes: normal lids, irises & conjunctiva . ENT: grossly normal tongue not enlarged . Neck: no masses . Cardiovascular: S1 S2 Normal no rubs no gallop . Respiratory: No rhonchi no rales are noted at this time . Abdomen: soft non-distended . Skin: no rash seen on limited exam . Musculoskeletal:  no rigidity . Psychiatric: unable to assess . Neurologic: no involuntary movements          Lab Data and radiological Data:  Sodium 145 potassium 3.3 BUN 25 creatinine 0.6   Assessment/Plan  Patient Active Problem List   Diagnosis Date Noted  . Acute on chronic respiratory failure with hypoxia (HCC)   . Chronic atrial fibrillation (HCC)   . Endocarditis of prosthetic valve (HCC)   . Severe sepsis (HCC)   . COPD, severe (HCC)       1. Acute on chronic respiratory failure with hypoxia at this time we will continue with weaning protocol patient was able to do 8 hours off the ventilator yesterday the goal today will be to go beyond 12 hours if at all possible. 2. Chronic atrial fibrillation rate is controlled at this time we will continue  to follow closely 3. Endocarditis treated we will continue to follow 4. Severe sepsis resolved 5. Severe COPD at baseline 6. Congestive heart failure patient is actively being diuresed   I have personally evaluated the patient, evaluated the laboratory and imaging results and formulated the assessment and plan and placed orders as needed. The Patient requires high complexity decision making with multiple system involvement. Rounds were done with the Respiratory Therapy Director and respiratory therapist involved in the care of the patient as well as nursing staff.   Yevonne Pax, MD [facility] Pulmonary Critical Care Medicine   This note is for inpatient care

## 2019-06-28 ENCOUNTER — Other Ambulatory Visit (HOSPITAL_COMMUNITY): Payer: Medicare Other | Admitting: Internal Medicine

## 2019-06-28 DIAGNOSIS — T826XXS Infection and inflammatory reaction due to cardiac valve prosthesis, sequela: Secondary | ICD-10-CM

## 2019-06-28 DIAGNOSIS — J9621 Acute and chronic respiratory failure with hypoxia: Secondary | ICD-10-CM | POA: Diagnosis not present

## 2019-06-28 DIAGNOSIS — I482 Chronic atrial fibrillation, unspecified: Secondary | ICD-10-CM | POA: Diagnosis not present

## 2019-06-28 DIAGNOSIS — A419 Sepsis, unspecified organism: Secondary | ICD-10-CM

## 2019-06-28 DIAGNOSIS — J449 Chronic obstructive pulmonary disease, unspecified: Secondary | ICD-10-CM

## 2019-06-28 DIAGNOSIS — I38 Endocarditis, valve unspecified: Secondary | ICD-10-CM

## 2019-06-28 DIAGNOSIS — R6520 Severe sepsis without septic shock: Secondary | ICD-10-CM

## 2019-06-28 NOTE — Progress Notes (Signed)
[facility]  PROGRESS NOTE  PULMONARY SERVICE ROUNDS  Date of Service: 06/28/2019  Troy Day  DOB: [birth year]  Referring physician: Larena Glassman, MD  HPI: Troy Day is a 79 y.o. male  being seen for Acute on Chronic Respiratory Failure.  Patient has been doing actually rather well yesterday completed more than 8 hours of RT collar right now is back on T collar on 35% FiO2 plan is to continue to wean on T collar for at least 12 to 16 hours  Review of Systems: Unremarkable other than noted in HPI  Allergies:  Reviewed on the [facility]  Medications: Reviewed  Vitals: Temperature 96.7 pulse 75 respiratory rate 20 blood pressure is 120/60 saturations 100%  Ventilator Settings: On T collar FiO2 35%  Physical Exam: . General:  calm and comfortable NAD . Eyes: normal lids, irises & conjunctiva . ENT: grossly normal tongue not enlarged . Neck: no masses . Cardiovascular: S1 S2 Normal no rubs no gallop . Respiratory: No rhonchi no rales are noted at this time . Abdomen: soft non-distended . Skin: no rash seen on limited exam . Musculoskeletal:  no rigidity . Psychiatric: unable to assess . Neurologic: no involuntary movements          Lab Data and radiological Data:  Sodium 144 potassium 5.0 BUN 25 creatinine 0.7   Assessment/Plan  Patient Active Problem List   Diagnosis Date Noted  . Acute on chronic respiratory failure with hypoxia (HCC)   . Chronic atrial fibrillation (HCC)   . Endocarditis of prosthetic valve (HCC)   . Severe sepsis (HCC)   . COPD, severe (HCC)       1. Acute on chronic respiratory failure with hypoxia plan continue to advance the wean as tolerated on T collar today goal 16 hours and possibly beyond 2. Chronic atrial fibrillation rate controlled 3. Endocarditis treated supportive care on antibiotics 4. Severe sepsis resolved 5. Severe COPD nebulizers as necessary 6. CHF diuretics and fluid management per primary care  team   I have personally evaluated the patient, evaluated the laboratory and imaging results and formulated the assessment and plan and placed orders as needed. The Patient requires high complexity decision making with multiple system involvement. Rounds were done with the Respiratory Therapy Director and respiratory therapist involved in the care of the patient as well as nursing staff.   Yevonne Pax, MD [facility] Pulmonary Critical Care Medicine   This note is for inpatient care

## 2019-06-29 ENCOUNTER — Other Ambulatory Visit (HOSPITAL_COMMUNITY): Payer: Medicare Other | Admitting: Internal Medicine

## 2019-06-29 DIAGNOSIS — T826XXS Infection and inflammatory reaction due to cardiac valve prosthesis, sequela: Secondary | ICD-10-CM

## 2019-06-29 DIAGNOSIS — J9621 Acute and chronic respiratory failure with hypoxia: Secondary | ICD-10-CM

## 2019-06-29 DIAGNOSIS — R6520 Severe sepsis without septic shock: Secondary | ICD-10-CM

## 2019-06-29 DIAGNOSIS — I38 Endocarditis, valve unspecified: Secondary | ICD-10-CM

## 2019-06-29 DIAGNOSIS — J449 Chronic obstructive pulmonary disease, unspecified: Secondary | ICD-10-CM

## 2019-06-29 DIAGNOSIS — I482 Chronic atrial fibrillation, unspecified: Secondary | ICD-10-CM

## 2019-06-29 DIAGNOSIS — A419 Sepsis, unspecified organism: Secondary | ICD-10-CM

## 2019-06-29 NOTE — Progress Notes (Signed)
[facility]  PROGRESS NOTE  PULMONARY SERVICE ROUNDS  Date of Service: 06/29/2019  Troy Day  DOB: 02-28-1941  Referring physician: Larena Glassman, MD  HPI: Troy Day is a 79 y.o. male  being seen for Acute on Chronic Respiratory Failure.  Patient has been doing very well with the weaning on T collar.  This morning was off the ventilator once again and has completed 24 hours off the vent  Review of Systems: Unremarkable other than noted in HPI  Allergies:  Reviewed on the [facility]  Medications: Reviewed  Vitals: Temperature 97.1 pulse 77 respiratory 20 blood pressure is 125/68 saturations 100%  Ventilator Settings: On T collar currently FiO2 35%  Physical Exam: . General:  calm and comfortable NAD . Eyes: normal lids, irises & conjunctiva . ENT: grossly normal tongue not enlarged . Neck: no masses . Cardiovascular: S1 S2 Normal no rubs no gallop . Respiratory: No rhonchi no rales are noted at this time . Abdomen: soft non-distended . Skin: no rash seen on limited exam . Musculoskeletal:  no rigidity . Psychiatric: unable to assess . Neurologic: no involuntary movements          Lab Data and radiological Data:  Sodium 143 potassium is 4.1 BUN 22 creatinine 0.7 White count 4.8 hemoglobin 7.4 hematocrit 24.4 platelet count 185   Assessment/Plan  Patient Active Problem List   Diagnosis Date Noted  . Acute on chronic respiratory failure with hypoxia (HCC)   . Chronic atrial fibrillation (HCC)   . Endocarditis of prosthetic valve (HCC)   . Severe sepsis (HCC)   . COPD, severe (HCC)       1. Acute on chronic respiratory failure hypoxia clinically improved doing well has been weaning now on T collar for 24 hours.  Patient is to be kept on the dry side and diurese as tolerated. 2. Chronic atrial fibrillation rate is controlled at this time. 3. Endocarditis treated we will continue to follow 4. Severe COPD nebulizers as necessary 5. Severe  sepsis this has resolved hemodynamics are stable   I have personally evaluated the patient, evaluated the laboratory and imaging results and formulated the assessment and plan and placed orders as needed. The Patient requires high complexity decision making with multiple system involvement. Rounds were done with the Respiratory Therapy Director and respiratory therapist involved in the care of the patient as well as nursing staff.  Time 35 minutes discussion with primary care team   Yevonne Pax, MD [facility] Pulmonary Critical Care Medicine   This note is for inpatient care

## 2019-06-30 ENCOUNTER — Other Ambulatory Visit (HOSPITAL_COMMUNITY): Payer: Medicare Other | Admitting: Internal Medicine

## 2019-06-30 DIAGNOSIS — J9621 Acute and chronic respiratory failure with hypoxia: Secondary | ICD-10-CM

## 2019-06-30 DIAGNOSIS — T826XXS Infection and inflammatory reaction due to cardiac valve prosthesis, sequela: Secondary | ICD-10-CM

## 2019-06-30 DIAGNOSIS — I482 Chronic atrial fibrillation, unspecified: Secondary | ICD-10-CM

## 2019-06-30 DIAGNOSIS — A419 Sepsis, unspecified organism: Secondary | ICD-10-CM

## 2019-06-30 DIAGNOSIS — I38 Endocarditis, valve unspecified: Secondary | ICD-10-CM

## 2019-06-30 DIAGNOSIS — J449 Chronic obstructive pulmonary disease, unspecified: Secondary | ICD-10-CM

## 2019-06-30 DIAGNOSIS — R6520 Severe sepsis without septic shock: Secondary | ICD-10-CM

## 2019-06-30 NOTE — Progress Notes (Signed)
[facility]  PROGRESS NOTE  PULMONARY SERVICE ROUNDS  Date of Service: 06/30/2019  Troy Day  DOB: 27-Dec-1940  Referring physician: Larena Glassman, MD  HPI: Troy Day is a 79 y.o. male  being seen for Acute on Chronic Respiratory Failure.  Patient currently is on T collar has been on 45% FiO2 with good saturations has had some issues with some bloody secretions which is felt to be secondary to anticoagulation so the Lovenox will be held  Review of Systems: Unremarkable other than noted in HPI  Allergies:  Reviewed on the [facility]  Medications: Reviewed  Vitals: Temperature 98.0 pulse 96 respiratory rate 22 blood pressure is 110/50 saturations 95%  Ventilator Settings: On T collar with an FiO2 of 45% has been off the ventilator for more than 48 hours now  Physical Exam: . General:  calm and comfortable NAD . Eyes: normal lids, irises & conjunctiva . ENT: grossly normal tongue not enlarged . Neck: no masses . Cardiovascular: S1 S2 Normal no rubs no gallop . Respiratory: Coarse breath sounds with few rhonchi . Abdomen: soft non-distended . Skin: no rash seen on limited exam . Musculoskeletal:  no rigidity . Psychiatric: unable to assess . Neurologic: no involuntary movements          Lab Data and radiological Data:  ABG pH 7.41 PCO2 66 PO2 66   Assessment/Plan  Patient Active Problem List   Diagnosis Date Noted  . Acute on chronic respiratory failure with hypoxia (HCC)   . Chronic atrial fibrillation (HCC)   . Endocarditis of prosthetic valve (HCC)   . Severe sepsis (HCC)   . COPD, severe (HCC)       1. Acute on chronic respiratory failure with hypoxia plan is to continue with T collar weaning hopefully we should be able to begin capping once the bleeding resolves and if this is due to anticoagulation it should resolve spontaneously 2. Chronic atrial fibrillation rate is controlled we will follow 3. Endocarditis treated continue to  monitor closely as far as anticoagulation is concerned 4. Severe sepsis resolved hemodynamics are stable 5. Severe COPD nebulizers as needed 6. Hemoptysis ice lavaged saline appears to be slowly improving   I have personally evaluated the patient, evaluated the laboratory and imaging results and formulated the assessment and plan and placed orders as needed. The Patient requires high complexity decision making with multiple system involvement. Rounds were done with the Respiratory Therapy Director and respiratory therapist involved in the care of the patient as well as nursing staff.  Time 35 minutes   Yevonne Pax, MD [facility] Pulmonary Critical Care Medicine   This note is for inpatient care

## 2019-07-01 ENCOUNTER — Other Ambulatory Visit (HOSPITAL_COMMUNITY): Payer: Medicare Other | Admitting: Internal Medicine

## 2019-07-01 DIAGNOSIS — I482 Chronic atrial fibrillation, unspecified: Secondary | ICD-10-CM

## 2019-07-01 DIAGNOSIS — J449 Chronic obstructive pulmonary disease, unspecified: Secondary | ICD-10-CM | POA: Diagnosis not present

## 2019-07-01 DIAGNOSIS — A419 Sepsis, unspecified organism: Secondary | ICD-10-CM | POA: Diagnosis not present

## 2019-07-01 DIAGNOSIS — J9621 Acute and chronic respiratory failure with hypoxia: Secondary | ICD-10-CM | POA: Diagnosis not present

## 2019-07-01 DIAGNOSIS — T826XXS Infection and inflammatory reaction due to cardiac valve prosthesis, sequela: Secondary | ICD-10-CM

## 2019-07-01 DIAGNOSIS — R6520 Severe sepsis without septic shock: Secondary | ICD-10-CM

## 2019-07-01 DIAGNOSIS — I38 Endocarditis, valve unspecified: Secondary | ICD-10-CM

## 2019-07-01 NOTE — Progress Notes (Signed)
[facility]  PROGRESS NOTE  PULMONARY SERVICE ROUNDS  Date of Service: 07/01/2019  Troy Day  DOB: 1940-07-09  Referring physician: Larena Glassman, MD  HPI: Troy Day is a 79 y.o. male  being seen for Acute on Chronic Respiratory Failure.  Patient is off the ventilator liberated is on T collar right now still requiring 50% FiO2 but overall has been showing signs of improvement  Review of Systems: Unremarkable other than noted in HPI  Allergies:  Reviewed on the [facility]  Medications: Reviewed  Vitals: Temperature is 98.8 pulse 84 respiratory 24 blood pressure is 120/70 saturations 100%  Ventilator Settings: Off the ventilator on T collar at this time  Physical Exam: . General:  calm and comfortable NAD . Eyes: normal lids, irises & conjunctiva . ENT: grossly normal tongue not enlarged . Neck: no masses . Cardiovascular: S1 S2 Normal no rubs no gallop . Respiratory: No rhonchi coarse breath sounds . Abdomen: soft non-distended . Skin: no rash seen on limited exam . Musculoskeletal:  no rigidity . Psychiatric: unable to assess . Neurologic: no involuntary movements          Lab Data and radiological Data:  Sodium 143 potassium 4.2 BUN 18 creatinine 0.6 White count 6 hemoglobin 7.7 hematocrit 24.9 platelet count 182  EXAM: Noncontrast Chest CT  INDICATION: Clinical trial-research protocol, see free text, PLEURAL EFFUSION, J96.01 Acute respiratory failure with hypoxia (CMS-HCC), J96.02 Acute respiratory failure with hypercapnia (CMS-HCC) 79 year old with a clinical history of pleural effusion for further evaluation.  Technique: Serial axial images from apices to posterior sulci. Both lung and soft tissue images were obtained. Dose reduction was obtained with Automatic Exposure Control (AEC). If AEC could not be utilized then dose was determined by manual adjustment of the mA and/or kV according to patient size.  Contrast Dose:  None  COMPARISON: CT chest performed 7 days earlier. Chest x-ray done yesterday  FINDINGS:  Moderate pleural effusions bilaterally, slightly larger on the left and increased in size since the study done on January 20. Non-consolidative airspace filling and groundglass attenuation in the lungs. In conjunction with interstitial thickening and vascular enlargement the findings are most suggestive of hydrostatic edema. A tracheostomy tube is present. The central airways are patent.  Mediastinal adenopathy is similar. A representative pretracheal lymph node (6:156) measures 16 x 12 mm, stable in size.  Cardiomegaly. Sternotomy. Aortic valvular prosthetic. 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  Portions of the upper abdomen included in the field-of-view as part of today's exam show no acute or concerning findings.  Impression: Moderate pleural effusions bilaterally, left greater than right, slightly increased in size since the January 20 exam.  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   Pulmonary parenchymal findings suggest hydrostatic edema and volume overload.  Unchanged mediastinal adenopathy.  MIPS 362: DICOM format image data available to non-affiliated external healthcare facilities or entities on a secure, media free, reciprocally searchable basis with patient authorization for at least a 12 month period after the study.    Electronically Signed by: Elenore Paddy, MD, [facility] Radiology Electronically Signed on: 07/01/2019 12:12 PM  Other Result Information     Assessment/Plan  Patient Active Problem List   Diagnosis Date Noted  . Acute on chronic respiratory failure with hypoxia (HCC)   . Chronic atrial fibrillation (HCC)   . Endocarditis of prosthetic valve (HCC)   .  Severe sepsis  ([place])   . COPD, severe ([place])       1. Acute on chronic respiratory failure with hypoxia patient is doing better has been liberated from the ventilator but still has significant pulmonary edema noted on the CT scan.  Patient's renal function appears to be intact so I think could tolerate more aggressive diuresis.  We will discuss with the primary care team to increase diuretics. 2. Chronic atrial fibrillation rate is controlled we will continue with supportive care 3. Endocarditis status post repair antibiotics 4. Severe sepsis resolved hemodynamics are stable 5. Severe COPD at baseline we will continue present management   I have personally evaluated the patient, evaluated the laboratory and imaging results and formulated the assessment and plan and placed orders as needed. The Patient requires high complexity decision making with multiple system involvement. Rounds were done with the Respiratory Therapy Director and respiratory therapist involved in the care of the patient as well as nursing staff.  Time 35 minutes   Allyne Gee, MD [facility] Pulmonary Critical Care Medicine   This note is for inpatient care

## 2019-07-02 ENCOUNTER — Other Ambulatory Visit (HOSPITAL_COMMUNITY): Payer: Medicare Other | Admitting: Internal Medicine

## 2019-07-02 DIAGNOSIS — R042 Hemoptysis: Secondary | ICD-10-CM

## 2019-07-02 DIAGNOSIS — R6520 Severe sepsis without septic shock: Secondary | ICD-10-CM

## 2019-07-02 DIAGNOSIS — I482 Chronic atrial fibrillation, unspecified: Secondary | ICD-10-CM

## 2019-07-02 DIAGNOSIS — A419 Sepsis, unspecified organism: Secondary | ICD-10-CM

## 2019-07-02 DIAGNOSIS — J449 Chronic obstructive pulmonary disease, unspecified: Secondary | ICD-10-CM | POA: Diagnosis not present

## 2019-07-02 DIAGNOSIS — T826XXS Infection and inflammatory reaction due to cardiac valve prosthesis, sequela: Secondary | ICD-10-CM

## 2019-07-02 DIAGNOSIS — I38 Endocarditis, valve unspecified: Secondary | ICD-10-CM

## 2019-07-02 DIAGNOSIS — J9621 Acute and chronic respiratory failure with hypoxia: Secondary | ICD-10-CM

## 2019-07-02 NOTE — Progress Notes (Signed)
Troy Day NOTE  PULMONARY SERVICE ROUNDS  Date of Service: 07/02/2019  Troy Day  DOB: 1940-07-10  Referring physician: Deanne Coffer, MD  HPI: Troy Day is a 79 y.o. male  being seen for Acute on Chronic Respiratory Failure.  Patient currently is on T collar has been on 40% FiO2 has been having slight hemoptysis which overall is better but still occasional blood is noted with suctioning.  He has been liberated from the ventilator  Review of Systems: Unremarkable other than noted in HPI  Allergies:  Reviewed on the [facility]  Medications: Reviewed  Vitals: Temperature 98.0 pulse 80 respiratory rate 20 blood pressure 100/50 saturations 99%  Ventilator Settings: Off the ventilator on T collar with an FiO2 of 40%  Physical Exam: . General:  calm and comfortable NAD . Eyes: normal lids, irises & conjunctiva . ENT: grossly normal tongue not enlarged . Neck: no masses . Cardiovascular: S1 S2 Normal no rubs no gallop . Respiratory: No rhonchi coarse breath sounds are noted . Abdomen: soft non-distended . Skin: no rash seen on limited exam . Musculoskeletal:  no rigidity . Psychiatric: unable to assess . Neurologic: no involuntary movements          Lab Data and radiological Data:  No labs noted at this time  EXAM: Noncontrast Chest CT  INDICATION: Clinical trial-research protocol, see free text, PLEURAL EFFUSION, J96.01 Acute respiratory failure with hypoxia (CMS-HCC), J96.02 Acute respiratory failure with hypercapnia (CMS-HCC) 79 year old with a clinical history of pleural effusion for further evaluation.  Technique: Serial axial images from apices to posterior sulci. Both lung and soft tissue images were obtained. Dose reduction was obtained with Automatic Exposure Control (AEC). If AEC could not be utilized then dose was determined by manual adjustment of the mA and/or kV according to patient size.  Contrast Dose:  None  COMPARISON: CT chest performed 7 days earlier. Chest x-ray done yesterday  FINDINGS:  Moderate pleural effusions bilaterally, slightly larger on the left and increased in size since the study done on January 20. Non-consolidative airspace filling and groundglass attenuation in the lungs. In conjunction with interstitial thickening and vascular enlargement the findings are most suggestive of hydrostatic edema. A tracheostomy tube is present. The central airways are patent.  Mediastinal adenopathy is similar. A representative pretracheal lymph node (6:156) measures 16 x 12 mm, stable in size.  Cardiomegaly. Sternotomy. Aortic valvular prosthetic. 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  Portions of the upper abdomen included in the field-of-view as part of today's exam show no acute or concerning findings.  Impression: Moderate pleural effusions bilaterally, left greater than right, slightly increased in size since the January 20 exam.  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   Pulmonary parenchymal findings suggest hydrostatic edema and volume overload.  Unchanged mediastinal adenopathy.  MIPS 362: DICOM format image data available to non-affiliated external healthcare facilities or entities on a secure, media free, reciprocally searchable basis with patient authorization for at least a 12 month period after the study.    Electronically Signed by: Oneida Alar, MD, [facility] Radiology Electronically Signed on: 07/01/2019 12:12 PM   Assessment/Plan  Patient Active Problem List   Diagnosis Date Noted  . Acute on chronic respiratory failure with hypoxia ([place])   . Chronic atrial fibrillation ([place])   . Endocarditis of prosthetic valve ([place])   . Severe  sepsis (HCC)   . COPD, severe (HCC)        1. Acute on chronic respiratory failure with hypoxia patient still had significant pulmonary edema noted on the last chest film will need to continue with diuresis as tolerated.  Patient's hemoptysis does seem to be improving primary care team is holding anticoagulations.  Patient is liberated from the ventilator and will have respiratory therapy assess for the possibility of capping soon 2. Chronic atrial fibrillation right now rate is controlled we will continue with supportive care 3. Endocarditis of bioprosthetic valve treated we will continue to monitor closely 4. Severe sepsis resolved hemodynamics are stable 5. COPD severe disease continue present management 6. Hemoptysis improving we will continue to monitor closely   I have personally evaluated the patient, evaluated the laboratory and imaging results and formulated the assessment and plan and placed orders as needed. The Patient requires high complexity decision making with multiple system involvement. Rounds were done with the Respiratory Therapy Director and respiratory therapist involved in the care of the patient as well as nursing staff.  Time 35 minutes   Yevonne Pax, MD [facility] Pulmonary Critical Care Medicine   This note is for inpatient care

## 2019-07-03 ENCOUNTER — Other Ambulatory Visit (HOSPITAL_COMMUNITY): Payer: Medicare Other | Admitting: Internal Medicine

## 2019-07-03 DIAGNOSIS — T826XXS Infection and inflammatory reaction due to cardiac valve prosthesis, sequela: Secondary | ICD-10-CM

## 2019-07-03 DIAGNOSIS — I482 Chronic atrial fibrillation, unspecified: Secondary | ICD-10-CM | POA: Diagnosis not present

## 2019-07-03 DIAGNOSIS — J449 Chronic obstructive pulmonary disease, unspecified: Secondary | ICD-10-CM

## 2019-07-03 DIAGNOSIS — A419 Sepsis, unspecified organism: Secondary | ICD-10-CM

## 2019-07-03 DIAGNOSIS — I38 Endocarditis, valve unspecified: Secondary | ICD-10-CM

## 2019-07-03 DIAGNOSIS — J9621 Acute and chronic respiratory failure with hypoxia: Secondary | ICD-10-CM | POA: Diagnosis not present

## 2019-07-03 DIAGNOSIS — J90 Pleural effusion, not elsewhere classified: Secondary | ICD-10-CM

## 2019-07-03 DIAGNOSIS — R6520 Severe sepsis without septic shock: Secondary | ICD-10-CM

## 2019-07-03 NOTE — Progress Notes (Signed)
[facility]  PROGRESS NOTE  PULMONARY SERVICE ROUNDS  Date of Service: 07/03/2019  Troy Day  DOB: 03-15-1941  Referring physician: Larena Glassman, MD  HPI: Troy Day is a 79 y.o. male  being seen for Acute on Chronic Respiratory Failure.  Patient at this time is on T collar on 40% FiO2.  He is supposed to have a thoracentesis done.  Patient did have a significant pleural effusion and I think that this may actually help with his respiratory status.  Right now requiring 40% FiO2 with good saturations.  He denied any specific complaints  Review of Systems: Unremarkable other than noted in HPI  Allergies:  Reviewed on the [facility]  Medications: Reviewed  Vitals: Temperature 97.8 pulse 86 respiratory rate 22 blood pressure is 108/49 saturations 99%  Ventilator Settings: On T collar with an FiO2 of 40%  Physical Exam: . General:  calm and comfortable NAD . Eyes: normal lids, irises & conjunctiva . ENT: grossly normal tongue not enlarged . Neck: no masses . Cardiovascular: S1 S2 Normal no rubs no gallop . Respiratory: No rhonchi coarse breath sounds . Abdomen: soft non-distended . Skin: no rash seen on limited exam . Musculoskeletal:  no rigidity . Psychiatric: unable to assess . Neurologic: no involuntary movements          Lab Data and radiological Data:  Sodium 143 potassium 4.6 BUN 26 creatinine 0.6 White count 6.8 hemoglobin 7.7 hematocrit 26.6 platelet count 219   Assessment/Plan  Patient Active Problem List   Diagnosis Date Noted  . Acute on chronic respiratory failure with hypoxia (HCC)   . Chronic atrial fibrillation (HCC)   . Endocarditis of prosthetic valve (HCC)   . Severe sepsis (HCC)   . COPD, severe (HCC)       1. Acute on chronic respiratory failure with hypoxia plan is going to be to continue with on T collar with an FiO2 of 40% patient has been liberated from the ventilator.  He does have a pleural effusion which needs to  be tapped 2. Chronic atrial fibrillation rate controlled 3. Endocarditis treated 4. Severe sepsis this has resolved on antibiotics 5. Severe COPD at baseline we will continue to follow along   I have personally evaluated the patient, evaluated the laboratory and imaging results and formulated the assessment and plan and placed orders as needed. The Patient requires high complexity decision making with multiple system involvement. Rounds were done with the Respiratory Therapy Director and respiratory therapist involved in the care of the patient as well as nursing staff.  Time 35 minutes   Yevonne Pax, MD [facility] Pulmonary Critical Care Medicine   This note is for inpatient care

## 2019-07-04 ENCOUNTER — Other Ambulatory Visit (HOSPITAL_COMMUNITY): Payer: Medicare Other | Admitting: Internal Medicine

## 2019-07-04 DIAGNOSIS — A419 Sepsis, unspecified organism: Secondary | ICD-10-CM

## 2019-07-04 DIAGNOSIS — R6520 Severe sepsis without septic shock: Secondary | ICD-10-CM

## 2019-07-04 DIAGNOSIS — T826XXS Infection and inflammatory reaction due to cardiac valve prosthesis, sequela: Secondary | ICD-10-CM

## 2019-07-04 DIAGNOSIS — J449 Chronic obstructive pulmonary disease, unspecified: Secondary | ICD-10-CM

## 2019-07-04 DIAGNOSIS — I482 Chronic atrial fibrillation, unspecified: Secondary | ICD-10-CM | POA: Diagnosis not present

## 2019-07-04 DIAGNOSIS — I38 Endocarditis, valve unspecified: Secondary | ICD-10-CM

## 2019-07-04 DIAGNOSIS — J9621 Acute and chronic respiratory failure with hypoxia: Secondary | ICD-10-CM | POA: Diagnosis not present

## 2019-07-04 NOTE — Progress Notes (Signed)
[facility]  PROGRESS NOTE  PULMONARY SERVICE ROUNDS  Date of Service: 07/04/2019  Troy Day  DOB: 06/11/1940  Referring physician: Larena Glassman, MD  HPI: Troy Day is a 79 y.o. male  being seen for Acute on Chronic Respiratory Failure.  Patient currently is on T collar has been on 45% FiO2.  Has been noted to have some desaturations but does appear to be in fluid overload again  Review of Systems: Unremarkable other than noted in HPI  Allergies:  Reviewed on the [facility]  Medications: Reviewed  Vitals: Temperature 97.3 pulse 88 respiratory rate 22 blood pressure is 96/58 saturation 97%  Ventilator Settings: Off the ventilator on T collar requiring FiO2 of 40%  Physical Exam: . General:  calm and comfortable NAD . Eyes: normal lids, irises & conjunctiva . ENT: grossly normal tongue not enlarged . Neck: no masses . Cardiovascular: S1 S2 Normal no rubs no gallop . Respiratory: Coarse breath sounds noted bilaterally . Abdomen: soft non-distended . Skin: no rash seen on limited exam . Musculoskeletal:  no rigidity . Psychiatric: unable to assess . Neurologic: no involuntary movements          Lab Data and radiological Data:  Data has been reviewed   Assessment/Plan  Patient Active Problem List   Diagnosis Date Noted  . Acute on chronic respiratory failure with hypoxia (HCC)   . Chronic atrial fibrillation (HCC)   . Endocarditis of prosthetic valve (HCC)   . Severe sepsis (HCC)   . COPD, severe (HCC)       1. Acute on chronic respiratory failure with hypoxia patient is on T collar FiO2 40% patient has some increase in secretions plan is to continue with aggressive pulmonary toilet supportive care. 2. Chronic atrial fibrillation rate is controlled we will continue to follow 3. Endocarditis has been treated we will continue supportive care 4. Severe sepsis resolved 5. COPD at baseline   I have personally evaluated the patient,  evaluated the laboratory and imaging results and formulated the assessment and plan and placed orders as needed. The Patient requires high complexity decision making with multiple system involvement. Rounds were done with the Respiratory Therapy Director and respiratory therapist involved in the care of the patient as well as nursing staff.   Yevonne Pax, MD [facility] Pulmonary Critical Care Medicine   This note is for inpatient care

## 2019-07-05 ENCOUNTER — Other Ambulatory Visit (HOSPITAL_COMMUNITY): Payer: Medicare Other | Admitting: Internal Medicine

## 2019-07-05 DIAGNOSIS — J9621 Acute and chronic respiratory failure with hypoxia: Secondary | ICD-10-CM

## 2019-07-05 DIAGNOSIS — T826XXS Infection and inflammatory reaction due to cardiac valve prosthesis, sequela: Secondary | ICD-10-CM | POA: Diagnosis not present

## 2019-07-05 DIAGNOSIS — J449 Chronic obstructive pulmonary disease, unspecified: Secondary | ICD-10-CM

## 2019-07-05 DIAGNOSIS — I482 Chronic atrial fibrillation, unspecified: Secondary | ICD-10-CM | POA: Diagnosis not present

## 2019-07-05 DIAGNOSIS — R6520 Severe sepsis without septic shock: Secondary | ICD-10-CM

## 2019-07-05 DIAGNOSIS — I38 Endocarditis, valve unspecified: Secondary | ICD-10-CM

## 2019-07-05 DIAGNOSIS — A419 Sepsis, unspecified organism: Secondary | ICD-10-CM

## 2019-07-05 NOTE — Progress Notes (Signed)
[facility]  PROGRESS NOTE  PULMONARY SERVICE ROUNDS  Date of Service: 07/05/2019  Troy Day  DOB: 01-20-1941  Referring physician: Larena Glassman, MD  HPI: Troy Day is a 79 y.o. male  being seen for Acute on Chronic Respiratory Failure.  Overnight the patient had to be placed back on the ventilator.  Patient had increased work of breathing it appears the patient is back in heart failure again.  Right now is on pressure control on FiO2 of 40%  Review of Systems: Unremarkable other than noted in HPI  Allergies:  Reviewed on the [facility]  Medications: Reviewed  Vitals: Temperature 98.2 pulse 80 respiratory rate 26 blood pressure is 100/50 saturations 97%  Ventilator Settings: Mode of ventilation pressure assist control FiO2 40% tidal volume 465 PEEP 8 inspiratory pressures 24  Physical Exam: . General:  calm and comfortable NAD . Eyes: normal lids, irises & conjunctiva . ENT: grossly normal tongue not enlarged . Neck: no masses . Cardiovascular: S1 S2 Normal no rubs no gallop . Respiratory: No rhonchi coarse breath sounds are noted at this time . Abdomen: soft non-distended . Skin: no rash seen on limited exam . Musculoskeletal:  no rigidity . Psychiatric: unable to assess . Neurologic: no involuntary movements          Lab Data and radiological Data:  No labs today   Assessment/Plan  Patient Active Problem List   Diagnosis Date Noted  . Acute on chronic respiratory failure with hypoxia (HCC)   . Chronic atrial fibrillation (HCC)   . Endocarditis of prosthetic valve (HCC)   . Severe sepsis (HCC)   . COPD, severe (HCC)       1. Acute on chronic respiratory failure with hypoxia plan for today patient will be rested on the ventilator.  Patient is requiring pressure control full support at this time will follow up on chest x-ray may need more aggressive diuresis 2. Chronic atrial fibrillation rate is controlled at this  time 3. Endocarditis treated 4. Severe sepsis this has resolved 5. Congestive heart failure with fluid overload and pulmonary edema needs aggressive diuresis 6. Severe COPD at baseline we will continue with present management   I have personally evaluated the patient, evaluated the laboratory and imaging results and formulated the assessment and plan and placed orders as needed. The Patient requires high complexity decision making with multiple system involvement. Rounds were done with the Respiratory Therapy Director and respiratory therapist involved in the care of the patient as well as nursing staff.   Yevonne Pax, MD [facility] Pulmonary Critical Care Medicine   This note is for inpatient care

## 2019-07-06 ENCOUNTER — Other Ambulatory Visit (HOSPITAL_COMMUNITY): Payer: Medicare Other | Admitting: Internal Medicine

## 2019-07-06 DIAGNOSIS — A419 Sepsis, unspecified organism: Secondary | ICD-10-CM

## 2019-07-06 DIAGNOSIS — T826XXS Infection and inflammatory reaction due to cardiac valve prosthesis, sequela: Secondary | ICD-10-CM

## 2019-07-06 DIAGNOSIS — J9621 Acute and chronic respiratory failure with hypoxia: Secondary | ICD-10-CM

## 2019-07-06 DIAGNOSIS — I482 Chronic atrial fibrillation, unspecified: Secondary | ICD-10-CM

## 2019-07-06 DIAGNOSIS — J449 Chronic obstructive pulmonary disease, unspecified: Secondary | ICD-10-CM

## 2019-07-06 DIAGNOSIS — I38 Endocarditis, valve unspecified: Secondary | ICD-10-CM

## 2019-07-06 DIAGNOSIS — R6520 Severe sepsis without septic shock: Secondary | ICD-10-CM

## 2019-07-06 NOTE — Progress Notes (Signed)
[facility]  PROGRESS NOTE  PULMONARY SERVICE ROUNDS  Date of Service: 07/06/2019  Troy Day  DOB: [birth year]  Referring physician: Larena Glassman, MD  HPI: Troy Day is a 79 y.o. male  being seen for Acute on Chronic Respiratory Failure.  Patient currently is on pressure support mode has been on 40% FiO2 currently is on 8/5 and good tidal volumes are noted  Review of Systems: Unremarkable other than noted in HPI  Allergies:  Reviewed on the [facility]  Medications: Reviewed  Vitals: Temperature is 98.2 pulse 71 respiratory rate 22 blood pressure is 110/50 saturations 96%  Ventilator Settings: Mode ventilation pressure support FiO2 40% pressure support 8 PEEP 5  Physical Exam: . General:  calm and comfortable NAD . Eyes: normal lids, irises & conjunctiva . ENT: grossly normal tongue not enlarged . Neck: no masses . Cardiovascular: S1 S2 Normal no rubs no gallop . Respiratory: No rhonchi no rales are noted at this time . Abdomen: soft non-distended . Skin: no rash seen on limited exam . Musculoskeletal:  no rigidity . Psychiatric: unable to assess . Neurologic: no involuntary movements          Lab Data and radiological Data:  White count 5 hemoglobin 7.3 hematocrit 25.0 platelet count 184 Sodium 144 potassium 3.8 BUN 39 creatinine 0.6  EXAM: Portable chest time, date  INDICATION: Pleural effusion [J90 (ICD-10-CM)]   Ordering Comments: Post left thoracentesis.  COMPARISON: none  TECHNIQUE: AP portable view of the chest was performed.  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 There has been interval decrease in size of the left pleural effusion status post thoracentesis. No pneumothorax. Stable bilateral vascular congestion with interstitial prominence of concern for pulmonary edema persists. No frank lobar consolidation noted.  Stable cardiomegaly with left ventricular prominence.  IMPRESSION: Interval decrease in left pleural effusion status post thoracentesis without pneumothorax. Persistent bilateral interstitial prominence with indistinct pulmonary vascular congestion most consistent with changes of pulmonary edema and otherwise stable chest.   Electronically Signed by: Collene Mares, MD, [facility] Electronically Signed on: 07/06/2019 3:17 PM   Assessment/Plan  Patient Active Problem List   Diagnosis Date Noted  . Acute on chronic respiratory failure with hypoxia (HCC)   . Chronic atrial fibrillation (HCC)   . Endocarditis of prosthetic valve (HCC)   . Severe sepsis (HCC)   . COPD, severe (HCC)       1. Acute on chronic respiratory failure with hypoxia patient currently is on pressure support mode has been on 40% FiO2 good saturations are noted at this time good volumes are noted at this time.  The last chest x-ray shows a decrease in left pleural effusion after thoracentesis. 2. Pleural effusion status post thoracentesis with improvement we will continue to monitor for recurrence 3. Chronic atrial fibrillation rate is controlled 4. Endocarditis treated 5. Severe sepsis this has resolved we will continue to monitor 6. Severe COPD at baseline   I have personally evaluated the patient, evaluated the laboratory and imaging results and formulated the assessment and plan and placed orders as needed. The Patient requires high complexity decision making with multiple system involvement. Rounds were done with the Respiratory Therapy Director and respiratory therapist involved in the care of the patient as well as nursing staff.   Yevonne Pax, MD [facility] Pulmonary Critical Care Medicine   This note is for inpatient care

## 2019-07-07 ENCOUNTER — Other Ambulatory Visit (HOSPITAL_COMMUNITY): Payer: Medicare Other | Admitting: Internal Medicine

## 2019-07-07 DIAGNOSIS — T826XXS Infection and inflammatory reaction due to cardiac valve prosthesis, sequela: Secondary | ICD-10-CM

## 2019-07-07 DIAGNOSIS — J449 Chronic obstructive pulmonary disease, unspecified: Secondary | ICD-10-CM

## 2019-07-07 DIAGNOSIS — R6520 Severe sepsis without septic shock: Secondary | ICD-10-CM

## 2019-07-07 DIAGNOSIS — J9621 Acute and chronic respiratory failure with hypoxia: Secondary | ICD-10-CM | POA: Diagnosis not present

## 2019-07-07 DIAGNOSIS — I38 Endocarditis, valve unspecified: Secondary | ICD-10-CM

## 2019-07-07 DIAGNOSIS — A419 Sepsis, unspecified organism: Secondary | ICD-10-CM

## 2019-07-07 DIAGNOSIS — I482 Chronic atrial fibrillation, unspecified: Secondary | ICD-10-CM

## 2019-07-07 NOTE — Progress Notes (Signed)
[facility]  PROGRESS NOTE  PULMONARY SERVICE ROUNDS  Date of Service: 07/07/2019  Troy Day  DOB: 05-Sep-1940  Referring physician: Larena Glassman, MD  HPI: Troy Day is a 79 y.o. male  being seen for Acute on Chronic Respiratory Failure.  Patient is currently on T collar he is doing better after the diagnosis and after thoracentesis  Review of Systems: Unremarkable other than noted in HPI  Allergies:  Reviewed on the [facility]  Medications: Reviewed  Vitals: Temperature is 97.0 pulse 77 respiratory rate 28 blood pressure is 107/78 saturations 100%  Ventilator Settings: Off the ventilator on T collar right now  Physical Exam: . General:  calm and comfortable NAD . Eyes: normal lids, irises & conjunctiva . ENT: grossly normal tongue not enlarged . Neck: no masses . Cardiovascular: S1 S2 Normal no rubs no gallop . Respiratory: No rhonchi coarse breath sounds . Abdomen: soft non-distended . Skin: no rash seen on limited exam . Musculoskeletal:  no rigidity . Psychiatric: unable to assess . Neurologic: no involuntary movements          Lab Data and radiological Data:  No labs to report at this time   Assessment/Plan  Patient Active Problem List   Diagnosis Date Noted  . Acute on chronic respiratory failure with hypoxia (HCC)   . Chronic atrial fibrillation (HCC)   . Endocarditis of prosthetic valve (HCC)   . Severe sepsis (HCC)   . COPD, severe (HCC)       1. Acute on chronic respiratory failure with hypoxia plan is to continue with T collar trials on 40% FiO2 patient has good saturations 2. Chronic atrial fibrillation rate controlled 3. CHF improving we will continue with diuresis 4. Endocarditis status post repair we will continue to monitor antibiotics as per primary care team 5. Severe sepsis patient remains on antibiotics 6. Severe COPD at baseline nebulizers as needed   I have personally evaluated the patient, evaluated the  laboratory and imaging results and formulated the assessment and plan and placed orders as needed. The Patient requires high complexity decision making with multiple system involvement. Rounds were done with the Respiratory Therapy Director and respiratory therapist involved in the care of the patient as well as nursing staff.   Yevonne Pax, MD [facility] Pulmonary Critical Care Medicine   This note is for inpatient care

## 2019-07-08 ENCOUNTER — Other Ambulatory Visit (HOSPITAL_COMMUNITY): Payer: Medicare Other | Admitting: Internal Medicine

## 2019-07-08 DIAGNOSIS — J9621 Acute and chronic respiratory failure with hypoxia: Secondary | ICD-10-CM | POA: Diagnosis not present

## 2019-07-08 DIAGNOSIS — I38 Endocarditis, valve unspecified: Secondary | ICD-10-CM

## 2019-07-08 DIAGNOSIS — I482 Chronic atrial fibrillation, unspecified: Secondary | ICD-10-CM | POA: Diagnosis not present

## 2019-07-08 DIAGNOSIS — T826XXS Infection and inflammatory reaction due to cardiac valve prosthesis, sequela: Secondary | ICD-10-CM

## 2019-07-08 DIAGNOSIS — R6520 Severe sepsis without septic shock: Secondary | ICD-10-CM

## 2019-07-08 DIAGNOSIS — J449 Chronic obstructive pulmonary disease, unspecified: Secondary | ICD-10-CM

## 2019-07-08 DIAGNOSIS — A419 Sepsis, unspecified organism: Secondary | ICD-10-CM

## 2019-07-08 NOTE — Progress Notes (Signed)
[facility]  PROGRESS NOTE  PULMONARY SERVICE ROUNDS  Date of Service: 07/08/2019  Troy Day  DOB: [birth year]  Referring physician: Larena Glassman, MD  HPI: Troy Day is a 79 y.o. male  being seen for Acute on Chronic Respiratory Failure.  Patient right now is on T collar has been on 45% FiO2 good saturations are noted  Review of Systems: Unremarkable other than noted in HPI  Allergies:  Reviewed on the [facility]  Medications: Reviewed  Vitals: Temperature is 97.0 pulse 78 respiratory 29 blood pressure is 110/70 saturations 100%  Ventilator Settings: On T collar FiO2 45%  Physical Exam: . General:  calm and comfortable NAD . Eyes: normal lids, irises & conjunctiva . ENT: grossly normal tongue not enlarged . Neck: no masses . Cardiovascular: S1 S2 Normal no rubs no gallop . Respiratory: No rhonchi coarse breath sounds . Abdomen: soft non-distended . Skin: no rash seen on limited exam . Musculoskeletal:  no rigidity . Psychiatric: unable to assess . Neurologic: no involuntary movements          Lab Data and radiological Data:  Sodium 143 potassium 3.9 BUN 35 creatinine 0.6 ABG 7.43 PCO2 66 PO2 65   Assessment/Plan  Patient Active Problem List   Diagnosis Date Noted  . Acute on chronic respiratory failure with hypoxia (HCC)   . Chronic atrial fibrillation (HCC)   . Endocarditis of prosthetic valve (HCC)   . Severe sepsis (HCC)   . COPD, severe (HCC)       1. Acute on chronic respiratory failure with hypoxia patient is back off the ventilator on weaning so we will try to continue to advance the wean as tolerated. 2. Chronic atrial fibrillation rate is controlled continue with supportive care 3. Chronically endocarditis treated antibiotics 4. Severe sepsis resolved 5. Severe COPD nebulizers as needed   I have personally evaluated the patient, evaluated the laboratory and imaging results and formulated the assessment and plan and  placed orders as needed. The Patient requires high complexity decision making with multiple system involvement. Rounds were done with the Respiratory Therapy Director and respiratory therapist involved in the care of the patient as well as nursing staff.   Yevonne Pax, MD [facility] Pulmonary Critical Care Medicine   This note is for inpatient care

## 2019-07-09 ENCOUNTER — Other Ambulatory Visit (HOSPITAL_COMMUNITY): Payer: Medicare Other | Admitting: Internal Medicine

## 2019-07-09 DIAGNOSIS — T826XXS Infection and inflammatory reaction due to cardiac valve prosthesis, sequela: Secondary | ICD-10-CM

## 2019-07-09 DIAGNOSIS — A419 Sepsis, unspecified organism: Secondary | ICD-10-CM

## 2019-07-09 DIAGNOSIS — I38 Endocarditis, valve unspecified: Secondary | ICD-10-CM

## 2019-07-09 DIAGNOSIS — I482 Chronic atrial fibrillation, unspecified: Secondary | ICD-10-CM | POA: Diagnosis not present

## 2019-07-09 DIAGNOSIS — J449 Chronic obstructive pulmonary disease, unspecified: Secondary | ICD-10-CM | POA: Diagnosis not present

## 2019-07-09 DIAGNOSIS — J9621 Acute and chronic respiratory failure with hypoxia: Secondary | ICD-10-CM

## 2019-07-09 DIAGNOSIS — R6520 Severe sepsis without septic shock: Secondary | ICD-10-CM

## 2019-07-09 NOTE — Progress Notes (Signed)
[facility]  PROGRESS NOTE  PULMONARY SERVICE ROUNDS  Date of Service: 07/09/2019  Troy Day  DOB: 12/26/1940  Referring physician: Larena Glassman, MD  HPI: Troy Day is a 79 y.o. male  being seen for Acute on Chronic Respiratory Failure.  Patient currently is off the ventilator on T collar.  Has been doing well is to be monitored closely however  Review of Systems: Unremarkable other than noted in HPI  Allergies:  Reviewed on the [facility]  Medications: Reviewed  Vitals: Temperature 96.4 pulse 89 respiratory 18 blood pressure is 104/56 saturations 100%  Ventilator Settings: On T collar comfortable right now  Physical Exam: . General:  calm and comfortable NAD . Eyes: normal lids, irises & conjunctiva . ENT: grossly normal tongue not enlarged . Neck: no masses . Cardiovascular: S1 S2 Normal no rubs no gallop . Respiratory: Coarse breath sounds with few rhonchi . Abdomen: soft non-distended . Skin: no rash seen on limited exam . Musculoskeletal:  no rigidity . Psychiatric: unable to assess . Neurologic: no involuntary movements          Lab Data and radiological Data:  No labs to report   Assessment/Plan  Patient Active Problem List   Diagnosis Date Noted  . Acute on chronic respiratory failure with hypoxia (HCC)   . Chronic atrial fibrillation (HCC)   . Endocarditis of prosthetic valve (HCC)   . Severe sepsis (HCC)   . COPD, severe (HCC)       1. Acute on chronic respiratory failure with hypoxia plan is to continue with T collar trials patient is tolerating well 2. Chronic atrial fibrillation rate is controlled 3. Endocarditis at baseline 4. Severe sepsis resolved 5. Severe COPD at baseline we will continue to monitor   I have personally evaluated the patient, evaluated the laboratory and imaging results and formulated the assessment and plan and placed orders as needed. The Patient requires high complexity decision making with  multiple system involvement. Rounds were done with the Respiratory Therapy Director and respiratory therapist involved in the care of the patient as well as nursing staff.   Yevonne Pax, MD [facility] Pulmonary Critical Care Medicine   This note is for inpatient care

## 2019-07-10 ENCOUNTER — Other Ambulatory Visit (HOSPITAL_COMMUNITY): Payer: Medicare Other | Admitting: Internal Medicine

## 2019-07-10 DIAGNOSIS — T826XXS Infection and inflammatory reaction due to cardiac valve prosthesis, sequela: Secondary | ICD-10-CM

## 2019-07-10 DIAGNOSIS — I482 Chronic atrial fibrillation, unspecified: Secondary | ICD-10-CM

## 2019-07-10 DIAGNOSIS — J9621 Acute and chronic respiratory failure with hypoxia: Secondary | ICD-10-CM

## 2019-07-10 DIAGNOSIS — R6520 Severe sepsis without septic shock: Secondary | ICD-10-CM

## 2019-07-10 DIAGNOSIS — A419 Sepsis, unspecified organism: Secondary | ICD-10-CM

## 2019-07-10 DIAGNOSIS — J449 Chronic obstructive pulmonary disease, unspecified: Secondary | ICD-10-CM

## 2019-07-10 DIAGNOSIS — I38 Endocarditis, valve unspecified: Secondary | ICD-10-CM

## 2019-07-10 NOTE — Progress Notes (Signed)
[facility]  PROGRESS NOTE  PULMONARY SERVICE ROUNDS  Date of Service: 07/10/2019  Troy Day  DOB: 08/07/1940  Referring physician: Larena Glassman, MD  HPI: Troy Day is a 79 y.o. male  being seen for Acute on Chronic Respiratory Failure.  Patient currently is on T collar has been on 40% FiO2 without any distress.  Review of Systems: Unremarkable other than noted in HPI  Allergies:  Reviewed on the [facility]  Medications: Reviewed  Vitals: Temperature is 96.1 pulse 90 respiratory 18 blood pressure is 104/52 saturations 98%  Ventilator Settings: Off the ventilator on T collar requiring 40% FiO2  Physical Exam: . General:  calm and comfortable NAD . Eyes: normal lids, irises & conjunctiva . ENT: grossly normal tongue not enlarged . Neck: no masses . Cardiovascular: S1 S2 Normal no rubs no gallop . Respiratory: No rhonchi no rales are noted at this time . Abdomen: soft non-distended . Skin: no rash seen on limited exam . Musculoskeletal:  no rigidity . Psychiatric: unable to assess . Neurologic: no involuntary movements          Lab Data and radiological Data:  Data reviewed   Assessment/Plan  Patient Active Problem List   Diagnosis Date Noted  . Acute on chronic respiratory failure with hypoxia (HCC)   . Chronic atrial fibrillation (HCC)   . Endocarditis of prosthetic valve (HCC)   . Severe sepsis (HCC)   . COPD, severe (HCC)       1. Acute on chronic respiratory failure with hypoxia plan is to continue with T collar weaning as tolerated right now is on 40% FiO2 good saturations are noted we will continue to advance 2. Chronic atrial fibrillation rate is controlled 3. Endocarditis treated improving 4. Severe sepsis resolved 5. Severe COPD at baseline we will continue present management   I have personally evaluated the patient, evaluated the laboratory and imaging results and formulated the assessment and plan and placed orders as  needed. The Patient requires high complexity decision making with multiple system involvement. Rounds were done with the Respiratory Therapy Director and respiratory therapist involved in the care of the patient as well as nursing staff.   Yevonne Pax, MD [facility] Pulmonary Critical Care Medicine   This note is for inpatient care

## 2019-07-11 ENCOUNTER — Other Ambulatory Visit (HOSPITAL_COMMUNITY): Payer: Medicare Other | Admitting: Internal Medicine

## 2019-07-11 DIAGNOSIS — I482 Chronic atrial fibrillation, unspecified: Secondary | ICD-10-CM | POA: Diagnosis not present

## 2019-07-11 DIAGNOSIS — J449 Chronic obstructive pulmonary disease, unspecified: Secondary | ICD-10-CM

## 2019-07-11 DIAGNOSIS — A419 Sepsis, unspecified organism: Secondary | ICD-10-CM

## 2019-07-11 DIAGNOSIS — T826XXS Infection and inflammatory reaction due to cardiac valve prosthesis, sequela: Secondary | ICD-10-CM | POA: Diagnosis not present

## 2019-07-11 DIAGNOSIS — J9621 Acute and chronic respiratory failure with hypoxia: Secondary | ICD-10-CM | POA: Diagnosis not present

## 2019-07-11 DIAGNOSIS — R6520 Severe sepsis without septic shock: Secondary | ICD-10-CM

## 2019-07-11 DIAGNOSIS — I38 Endocarditis, valve unspecified: Secondary | ICD-10-CM

## 2019-07-11 NOTE — Progress Notes (Signed)
[facility]  PROGRESS NOTE  PULMONARY SERVICE ROUNDS  Date of Service: 07/11/2019  Troy Day  DOB: 01-13-1941  Referring physician: Larena Glassman, MD  HPI: Troy Day is a 79 y.o. male  being seen for Acute on Chronic Respiratory Failure.  Currently is on T collar has been on 60% FiO2 with good saturations are noted at this time without any distress.  Review of Systems: Unremarkable other than noted in HPI  Allergies:  Reviewed on the [facility]  Medications: Reviewed  Vitals: Temperature 97.3 pulse 97 respiratory rate 20 blood pressure 100/60 saturations 98%  Ventilator Settings: On T collar currently on 60% FiO2 good saturations  Physical Exam: . General:  calm and comfortable NAD . Eyes: normal lids, irises & conjunctiva . ENT: grossly normal tongue not enlarged . Neck: no masses . Cardiovascular: S1 S2 Normal no rubs no gallop . Respiratory: Scattered rhonchi expansion equal . Abdomen: soft non-distended . Skin: no rash seen on limited exam . Musculoskeletal:  no rigidity . Psychiatric: unable to assess . Neurologic: no involuntary movements          Lab Data and radiological Data:  No labs noted today   Assessment/Plan  Patient Active Problem List   Diagnosis Date Noted  . Acute on chronic respiratory failure with hypoxia (HCC)   . Chronic atrial fibrillation (HCC)   . Endocarditis of prosthetic valve (HCC)   . Severe sepsis (HCC)   . COPD, severe (HCC)       1. Acute on chronic respiratory failure with hypoxia patient currently is on T collar oxygen requirements are 60% try to wean FiO2 down as tolerated 2. Chronic atrial fibrillation rate is controlled 3. Endocarditis treated 4. Severe sepsis hemodynamics are stable 5. Severe COPD at baseline we will continue to follow along   I have personally evaluated the patient, evaluated the laboratory and imaging results and formulated the assessment and plan and placed orders as  needed. The Patient requires high complexity decision making with multiple system involvement. Rounds were done with the Respiratory Therapy Director and respiratory therapist involved in the care of the patient as well as nursing staff.   Yevonne Pax, MD [facility] Pulmonary Critical Care Medicine   This note is for inpatient care

## 2019-07-12 ENCOUNTER — Other Ambulatory Visit (HOSPITAL_COMMUNITY): Payer: Medicare Other | Admitting: Internal Medicine

## 2019-07-12 DIAGNOSIS — R6520 Severe sepsis without septic shock: Secondary | ICD-10-CM

## 2019-07-12 DIAGNOSIS — J449 Chronic obstructive pulmonary disease, unspecified: Secondary | ICD-10-CM

## 2019-07-12 DIAGNOSIS — I482 Chronic atrial fibrillation, unspecified: Secondary | ICD-10-CM

## 2019-07-12 DIAGNOSIS — J9621 Acute and chronic respiratory failure with hypoxia: Secondary | ICD-10-CM | POA: Diagnosis not present

## 2019-07-12 DIAGNOSIS — T826XXS Infection and inflammatory reaction due to cardiac valve prosthesis, sequela: Secondary | ICD-10-CM | POA: Diagnosis not present

## 2019-07-12 DIAGNOSIS — I38 Endocarditis, valve unspecified: Secondary | ICD-10-CM

## 2019-07-12 DIAGNOSIS — A419 Sepsis, unspecified organism: Secondary | ICD-10-CM

## 2019-07-12 NOTE — Progress Notes (Signed)
[facility]  PROGRESS NOTE  PULMONARY SERVICE ROUNDS  Date of Service: 07/12/2019  Troy Day  DOB: 01-06-1941  Referring physician: Larena Glassman, MD  HPI: Troy Day is a 79 y.o. male  being seen for Acute on Chronic Respiratory Failure.  Patient is on full support right now on pressure control mode has been on 50% FiO2 good saturations are noted right now.  Overnight patient was placed back on the ventilator ABG that is done right now actually looks good  Review of Systems: Unremarkable other than noted in HPI  Allergies:  Reviewed on the [facility]  Medications: Reviewed  Vitals: Temperature 97.1 pulse 80 respiratory 24 blood pressure 85/46 saturations 100%  Ventilator Settings: Mode of ventilation pressure assist control FiO2 50% tidal volume is 718 PEEP 8 IP 20  Physical Exam: . General:  calm and comfortable NAD . Eyes: normal lids, irises & conjunctiva . ENT: grossly normal tongue not enlarged . Neck: no masses . Cardiovascular: S1 S2 Normal no rubs no gallop . Respiratory: Scattered rhonchi . Abdomen: soft non-distended . Skin: no rash seen on limited exam . Musculoskeletal:  no rigidity . Psychiatric: unable to assess . Neurologic: no involuntary movements          Lab Data and radiological Data:  ABG pH 7.42 PCO2 67 PO2 167   Assessment/Plan  Patient Active Problem List   Diagnosis Date Noted  . Acute on chronic respiratory failure with hypoxia (HCC)   . Chronic atrial fibrillation (HCC)   . Endocarditis of prosthetic valve (HCC)   . Severe sepsis (HCC)   . COPD, severe (HCC)       1. Acute on chronic respiratory failure with hypoxia patient right now is being over ventilated respiratory therapy is going to switch him back over to wean on T collar plan will be to continue to wean on T collar as tolerated 2. Chronic atrial fibrillation rate controlled 3. Endocarditis treated 4. Severe sepsis this has resolved we will continue  to monitor 5. Severe COPD nebulizers as necessary   I have personally evaluated the patient, evaluated the laboratory and imaging results and formulated the assessment and plan and placed orders as needed. The Patient requires high complexity decision making with multiple system involvement. Rounds were done with the Respiratory Therapy Director and respiratory therapist involved in the care of the patient as well as nursing staff.   Yevonne Pax, MD [facility] Pulmonary Critical Care Medicine   This note is for inpatient care

## 2019-07-13 ENCOUNTER — Other Ambulatory Visit (HOSPITAL_COMMUNITY): Payer: Medicare Other | Admitting: Internal Medicine

## 2019-07-13 DIAGNOSIS — I38 Endocarditis, valve unspecified: Secondary | ICD-10-CM

## 2019-07-13 DIAGNOSIS — J9621 Acute and chronic respiratory failure with hypoxia: Secondary | ICD-10-CM | POA: Diagnosis not present

## 2019-07-13 DIAGNOSIS — A419 Sepsis, unspecified organism: Secondary | ICD-10-CM

## 2019-07-13 DIAGNOSIS — I482 Chronic atrial fibrillation, unspecified: Secondary | ICD-10-CM

## 2019-07-13 DIAGNOSIS — R6520 Severe sepsis without septic shock: Secondary | ICD-10-CM

## 2019-07-13 DIAGNOSIS — J449 Chronic obstructive pulmonary disease, unspecified: Secondary | ICD-10-CM

## 2019-07-13 DIAGNOSIS — T826XXS Infection and inflammatory reaction due to cardiac valve prosthesis, sequela: Secondary | ICD-10-CM

## 2019-07-13 NOTE — Progress Notes (Signed)
[facility]  PROGRESS NOTE  PULMONARY SERVICE ROUNDS  Date of Service: 07/13/2019  Troy Day  DOB: 1940/09/11  Referring physician: Larena Glassman, MD  HPI: Troy Day is a 79 y.o. male  being seen for Acute on Chronic Respiratory Failure.  Patient currently is on full support on the ventilator on pressure control mode has been weaning on T collar however has been having some difficulty of late his oxygen requirements are up to 50%.  BUN is also elevated at 62 patient has been diuresed aggressively  Review of Systems: Unremarkable other than noted in HPI  Allergies:  Reviewed on the [facility]  Medications: Reviewed  Vitals: Temperature 96.0 pulse 75 respiratory 21 blood pressure is 108/41 saturations 100%  Ventilator Settings: Right now on pressure assist control FiO2 is 50% tidal volume 450 PEEP 20 PEEP 8  Physical Exam: . General:  calm and comfortable NAD . Eyes: normal lids, irises & conjunctiva . ENT: grossly normal tongue not enlarged . Neck: no masses . Cardiovascular: S1 S2 Normal no rubs no gallop . Respiratory: Coarse rhonchi expansion is equal . Abdomen: soft non-distended . Skin: no rash seen on limited exam . Musculoskeletal:  no rigidity . Psychiatric: unable to assess . Neurologic: no involuntary movements          Lab Data and radiological Data:  Sodium 146 potassium 3.9 BUN 62 creatinine 0.7 White count 5.7 hemoglobin 7.4 hematocrit 24.7 platelet count 156   Assessment/Plan  Patient Active Problem List   Diagnosis Date Noted  . Acute on chronic respiratory failure with hypoxia (HCC)   . Chronic atrial fibrillation (HCC)   . Endocarditis of prosthetic valve (HCC)   . Severe sepsis (HCC)   . COPD, severe (HCC)       1. Acute on chronic respiratory failure with hypoxia plan is to continue with pressure control mode continue secretion management supportive care 2. Chronic atrial fibrillation rate is  controlled 3. Endocarditis no change 4. Severe sepsis resolved 5. Severe COPD at baseline   I have personally evaluated the patient, evaluated the laboratory and imaging results and formulated the assessment and plan and placed orders as needed. The Patient requires high complexity decision making with multiple system involvement. Rounds were done with the Respiratory Therapy Director and respiratory therapist involved in the care of the patient as well as nursing staff.   Yevonne Pax, MD [facility] Pulmonary Critical Care Medicine   This note is for inpatient care

## 2019-07-14 ENCOUNTER — Other Ambulatory Visit (HOSPITAL_COMMUNITY): Payer: Medicare Other | Admitting: Internal Medicine

## 2019-07-14 DIAGNOSIS — R6520 Severe sepsis without septic shock: Secondary | ICD-10-CM

## 2019-07-14 DIAGNOSIS — T826XXS Infection and inflammatory reaction due to cardiac valve prosthesis, sequela: Secondary | ICD-10-CM

## 2019-07-14 DIAGNOSIS — J9621 Acute and chronic respiratory failure with hypoxia: Secondary | ICD-10-CM

## 2019-07-14 DIAGNOSIS — A419 Sepsis, unspecified organism: Secondary | ICD-10-CM

## 2019-07-14 DIAGNOSIS — I38 Endocarditis, valve unspecified: Secondary | ICD-10-CM

## 2019-07-14 DIAGNOSIS — I482 Chronic atrial fibrillation, unspecified: Secondary | ICD-10-CM

## 2019-07-14 DIAGNOSIS — J449 Chronic obstructive pulmonary disease, unspecified: Secondary | ICD-10-CM

## 2019-07-14 NOTE — Progress Notes (Signed)
[facility]  PROGRESS NOTE  PULMONARY SERVICE ROUNDS  Date of Service: 07/14/2019  Troy Day  DOB: [birth year]  Referring physician: Larena Glassman, MD  HPI: Troy Day is a 79 y.o. male  being seen for Acute on Chronic Respiratory Failure.  Patient is on the pressure control mode right now on the ventilator requiring 50% FiO2  Review of Systems: Unremarkable other than noted in HPI  Allergies:  Reviewed on the [facility]  Medications: Reviewed  Vitals: Temperature 96.0 pulse 73 respiratory 24 blood pressure is 108/53 saturations 100%  Ventilator Settings: Mode of ventilation pressure assist control FiO2 50% respiratory pressure 16 PEEP 8  Physical Exam: . General:  calm and comfortable NAD . Eyes: normal lids, irises & conjunctiva . ENT: grossly normal tongue not enlarged . Neck: no masses . Cardiovascular: S1 S2 Normal no rubs no gallop . Respiratory: Scattered rhonchi expansion is equal . Abdomen: soft non-distended . Skin: no rash seen on limited exam . Musculoskeletal:  no rigidity . Psychiatric: unable to assess . Neurologic: no involuntary movements          Lab Data and radiological Data:  Sodium 148 potassium 3.9 BUN 55 creatinine 0.8 White count 4.2 hemoglobin 7.7 hematocrit 25.3 platelet count 151   Assessment/Plan  Patient Active Problem List   Diagnosis Date Noted  . Acute on chronic respiratory failure with hypoxia (HCC)   . Chronic atrial fibrillation (HCC)   . Endocarditis of prosthetic valve (HCC)   . Severe sepsis (HCC)   . COPD, severe (HCC)       1. Acute on chronic respiratory failure with hypoxia continue with full support on the ventilator right now patient is not weaning today 2. Chronic atrial fibrillation rate controlled 3. Endocarditis treated 4. Severe sepsis resolved 5. Severe COPD nebulizers as needed   I have personally evaluated the patient, evaluated the laboratory and imaging results and formulated  the assessment and plan and placed orders as needed. The Patient requires high complexity decision making with multiple system involvement. Rounds were done with the Respiratory Therapy Director and respiratory therapist involved in the care of the patient as well as nursing staff.   Yevonne Pax, MD [facility] Pulmonary Critical Care Medicine   This note is for inpatient care

## 2019-07-15 ENCOUNTER — Other Ambulatory Visit (HOSPITAL_COMMUNITY): Payer: Medicare Other | Admitting: Internal Medicine

## 2019-07-15 DIAGNOSIS — J449 Chronic obstructive pulmonary disease, unspecified: Secondary | ICD-10-CM | POA: Diagnosis not present

## 2019-07-15 DIAGNOSIS — T826XXS Infection and inflammatory reaction due to cardiac valve prosthesis, sequela: Secondary | ICD-10-CM | POA: Diagnosis not present

## 2019-07-15 DIAGNOSIS — I38 Endocarditis, valve unspecified: Secondary | ICD-10-CM

## 2019-07-15 DIAGNOSIS — A419 Sepsis, unspecified organism: Secondary | ICD-10-CM

## 2019-07-15 DIAGNOSIS — J9621 Acute and chronic respiratory failure with hypoxia: Secondary | ICD-10-CM | POA: Diagnosis not present

## 2019-07-15 DIAGNOSIS — I482 Chronic atrial fibrillation, unspecified: Secondary | ICD-10-CM

## 2019-07-15 DIAGNOSIS — R6520 Severe sepsis without septic shock: Secondary | ICD-10-CM

## 2019-07-15 NOTE — Progress Notes (Signed)
[facility]  PROGRESS NOTE  PULMONARY SERVICE ROUNDS  Date of Service: 07/15/2019  Troy Day  DOB: 13-Mar-1941  Referring physician: Larena Glassman, MD  HPI: Troy Day is a 79 y.o. male  being seen for Acute on Chronic Respiratory Failure.  Patient is back on pressure support right now is on 40% FiO2 good volumes were going to try T collar today  Review of Systems: Unremarkable other than noted in HPI  Allergies:  Reviewed on the [facility]  Medications: Reviewed  Vitals: Temperature 96.1 pulse 69 respiratory 20 blood pressure is 112/62 saturations 100%  Ventilator Settings: Mode ventilation pressure 45 to 40% pressure poor 12 PEEP 8 tidal volume 429  Physical Exam: . General:  calm and comfortable NAD . Eyes: normal lids, irises & conjunctiva . ENT: grossly normal tongue not enlarged . Neck: no masses . Cardiovascular: S1 S2 Normal no rubs no gallop . Respiratory: Scattered rhonchi expansion is equal . Abdomen: soft non-distended . Skin: no rash seen on limited exam . Musculoskeletal:  no rigidity . Psychiatric: unable to assess . Neurologic: no involuntary movements          Lab Data and radiological Data:  No labs at this time   Assessment/Plan  Patient Active Problem List   Diagnosis Date Noted  . Acute on chronic respiratory failure with hypoxia (HCC)   . Chronic atrial fibrillation (HCC)   . Endocarditis of prosthetic valve (HCC)   . Severe sepsis (HCC)   . COPD, severe (HCC)       1. Acute on chronic respiratory failure with hypoxia patient continues on pressure support mode on 40% FiO2 plan is to try T collar once again 2. Chronic atrial fibrillation rate controlled 3. Endocarditis treated 4. Severe sepsis resolved 5. Severe COPD at baseline continue to monitor   I have personally evaluated the patient, evaluated the laboratory and imaging results and formulated the assessment and plan and placed orders as needed. The  Patient requires high complexity decision making with multiple system involvement. Rounds were done with the Respiratory Therapy Director and respiratory therapist involved in the care of the patient as well as nursing staff.   Yevonne Pax, MD [facility] Pulmonary Critical Care Medicine   This note is for inpatient care

## 2019-07-16 ENCOUNTER — Other Ambulatory Visit (HOSPITAL_COMMUNITY): Payer: Medicare Other | Admitting: Internal Medicine

## 2019-07-16 DIAGNOSIS — I38 Endocarditis, valve unspecified: Secondary | ICD-10-CM

## 2019-07-16 DIAGNOSIS — I482 Chronic atrial fibrillation, unspecified: Secondary | ICD-10-CM | POA: Diagnosis not present

## 2019-07-16 DIAGNOSIS — J449 Chronic obstructive pulmonary disease, unspecified: Secondary | ICD-10-CM | POA: Diagnosis not present

## 2019-07-16 DIAGNOSIS — J9621 Acute and chronic respiratory failure with hypoxia: Secondary | ICD-10-CM

## 2019-07-16 DIAGNOSIS — R6520 Severe sepsis without septic shock: Secondary | ICD-10-CM

## 2019-07-16 DIAGNOSIS — T826XXS Infection and inflammatory reaction due to cardiac valve prosthesis, sequela: Secondary | ICD-10-CM

## 2019-07-16 DIAGNOSIS — A419 Sepsis, unspecified organism: Secondary | ICD-10-CM

## 2019-07-16 NOTE — Progress Notes (Signed)
[facility]  PROGRESS NOTE  PULMONARY SERVICE ROUNDS  Date of Service: 07/16/2019  Troy Day  DOB: Mar 04, 1941  Referring physician: Larena Glassman, MD  HPI: Troy Day is a 79 y.o. male  being seen for Acute on Chronic Respiratory Failure.  Patient is currently on the ventilator was on pressure assist control mode no obvious distress noted at this time  Review of Systems: Unremarkable other than noted in HPI  Allergies:  Reviewed on the [facility]  Medications: Reviewed  Vitals: Temperature 96.5 pulse 74 respiratory rate 28 blood pressure is 122/56 saturations 98%  Ventilator Settings: Mode of ventilation pressure assist control FiO2 40% tidal volume 382 PEEP 8 IP 20  Physical Exam: . General:  calm and comfortable NAD . Eyes: normal lids, irises & conjunctiva . ENT: grossly normal tongue not enlarged . Neck: no masses . Cardiovascular: S1 S2 Normal no rubs no gallop . Respiratory: Coarse breath sounds with few scattered rhonchi . Abdomen: soft non-distended . Skin: no rash seen on limited exam . Musculoskeletal:  no rigidity . Psychiatric: unable to assess . Neurologic: no involuntary movements          Lab Data and radiological Data:  Sodium 150 potassium 3.6 BUN 62 creatinine 0.8 White count 3.5 hemoglobin 7.2 hematocrit 24.3 platelet count 159   Assessment/Plan  Patient Active Problem List   Diagnosis Date Noted  . Acute on chronic respiratory failure with hypoxia (HCC)   . Chronic atrial fibrillation (HCC)   . Endocarditis of prosthetic valve (HCC)   . Severe sepsis (HCC)   . COPD, severe (HCC)       1. Acute on chronic respiratory failure with hypoxia plan is to continue with full support on the ventilator not tolerating weaning attempts today 2. Chronic atrial fibrillation rate is controlled 3. Endocarditis no change at this time we will continue to follow 4. Severe sepsis resolved 5. Severe COPD at baseline we will continue  with present management   I have personally evaluated the patient, evaluated the laboratory and imaging results and formulated the assessment and plan and placed orders as needed. The Patient requires high complexity decision making with multiple system involvement. Rounds were done with the Respiratory Therapy Director and respiratory therapist involved in the care of the patient as well as nursing staff.   Yevonne Pax, MD [facility] Pulmonary Critical Care Medicine   This note is for inpatient care

## 2019-07-17 ENCOUNTER — Other Ambulatory Visit (HOSPITAL_COMMUNITY): Payer: Medicare Other | Admitting: Internal Medicine

## 2019-07-17 DIAGNOSIS — T826XXS Infection and inflammatory reaction due to cardiac valve prosthesis, sequela: Secondary | ICD-10-CM | POA: Diagnosis not present

## 2019-07-17 DIAGNOSIS — J9621 Acute and chronic respiratory failure with hypoxia: Secondary | ICD-10-CM

## 2019-07-17 DIAGNOSIS — I38 Endocarditis, valve unspecified: Secondary | ICD-10-CM

## 2019-07-17 DIAGNOSIS — J449 Chronic obstructive pulmonary disease, unspecified: Secondary | ICD-10-CM

## 2019-07-17 DIAGNOSIS — R6520 Severe sepsis without septic shock: Secondary | ICD-10-CM

## 2019-07-17 DIAGNOSIS — I482 Chronic atrial fibrillation, unspecified: Secondary | ICD-10-CM

## 2019-07-17 DIAGNOSIS — A419 Sepsis, unspecified organism: Secondary | ICD-10-CM

## 2019-07-17 NOTE — Progress Notes (Signed)
[facility]  PROGRESS NOTE  PULMONARY SERVICE ROUNDS  Date of Service: 07/17/2019  Troy Day  DOB: [birth year]  Referring physician: Larena Glassman, MD  HPI: Troy Day is a 79 y.o. male  being seen for Acute on Chronic Respiratory Failure.  Patient remains on the ventilator and full support.  His hemoglobin was down to 6 the weaning is being held at this time  Review of Systems: Unremarkable other than noted in HPI  Allergies:  Reviewed on the [facility]  Medications: Reviewed  Vitals: Temperature 97.3 pulse 66 respiratory 20 blood pressure is 91/51 saturations 100%  Ventilator Settings: Mode of ventilation pressure control FiO2 40% tidal line 532 IP 20 PEEP 8  Physical Exam: . General:  calm and comfortable NAD . Eyes: normal lids, irises & conjunctiva . ENT: grossly normal tongue not enlarged . Neck: no masses . Cardiovascular: S1 S2 Normal no rubs no gallop . Respiratory: Coarse rhonchi expansion is equal . Abdomen: soft non-distended . Skin: no rash seen on limited exam . Musculoskeletal:  no rigidity . Psychiatric: unable to assess . Neurologic: no involuntary movements          Lab Data and radiological Data:  White count is 4 hemoglobin 6.8 hematocrit 22.9 platelet count 151 Sodium 147 potassium 3.9 BUN 61 creatinine 0.8   Assessment/Plan  Patient Active Problem List   Diagnosis Date Noted  . Acute on chronic respiratory failure with hypoxia (HCC)   . Chronic atrial fibrillation (HCC)   . Endocarditis of prosthetic valve (HCC)   . Severe sepsis (HCC)   . COPD, severe (HCC)       1. Acute on chronic respiratory failure hypoxia plan is to continue with full support on the ventilator on pressure control right now is on an FiO2 of 40% IP 20 PEEP 8 holding off on the wean because of the requirement for blood transfusion 2. Chronic atrial fibrillation rate is controlled 3. Endocarditis treated 4. Severe sepsis right now hemodynamics  are stable 5. Severe COPD continue with nebulizers as needed   I have personally evaluated the patient, evaluated the laboratory and imaging results and formulated the assessment and plan and placed orders as needed. The Patient requires high complexity decision making with multiple system involvement. Rounds were done with the Respiratory Therapy Director and respiratory therapist involved in the care of the patient as well as nursing staff.   Yevonne Pax, MD [facility] Pulmonary Critical Care Medicine   This note is for inpatient care

## 2019-07-18 ENCOUNTER — Other Ambulatory Visit (HOSPITAL_COMMUNITY): Payer: Medicare Other | Admitting: Internal Medicine

## 2019-07-18 DIAGNOSIS — J449 Chronic obstructive pulmonary disease, unspecified: Secondary | ICD-10-CM

## 2019-07-18 DIAGNOSIS — I482 Chronic atrial fibrillation, unspecified: Secondary | ICD-10-CM | POA: Diagnosis not present

## 2019-07-18 DIAGNOSIS — J9621 Acute and chronic respiratory failure with hypoxia: Secondary | ICD-10-CM | POA: Diagnosis not present

## 2019-07-18 DIAGNOSIS — R6520 Severe sepsis without septic shock: Secondary | ICD-10-CM

## 2019-07-18 DIAGNOSIS — T826XXS Infection and inflammatory reaction due to cardiac valve prosthesis, sequela: Secondary | ICD-10-CM | POA: Diagnosis not present

## 2019-07-18 DIAGNOSIS — A419 Sepsis, unspecified organism: Secondary | ICD-10-CM

## 2019-07-18 DIAGNOSIS — I38 Endocarditis, valve unspecified: Secondary | ICD-10-CM

## 2019-07-18 NOTE — Progress Notes (Signed)
[facility]  PROGRESS NOTE  PULMONARY SERVICE ROUNDS  Date of Service: 07/18/2019  Troy Day  DOB: 05-Nov-1940  Referring physician: Larena Glassman, MD  HPI: Troy Day is a 79 y.o. male  being seen for Acute on Chronic Respiratory Failure.  Patient is on the ventilator on full support was not weaning today currently has an ileus going on right now so the weaning is being held  Review of Systems: Unremarkable other than noted in HPI  Allergies:  Reviewed on the [facility]  Medications: Reviewed  Vitals: Temperature is 98.0 pulse 89 respiratory rate 24 blood pressure is 110/50 saturations 100%  Ventilator Settings: Mode of ventilation pressure assist control FiO2 is 45% IP 28 PEEP 8  Physical Exam: . General:  calm and comfortable NAD . Eyes: normal lids, irises & conjunctiva . ENT: grossly normal tongue not enlarged . Neck: no masses . Cardiovascular: S1 S2 Normal no rubs no gallop . Respiratory: Scattered rhonchi expansion is equal at this time . Abdomen: soft non-distended . Skin: no rash seen on limited exam . Musculoskeletal:  no rigidity . Psychiatric: unable to assess . Neurologic: no involuntary movements          Lab Data and radiological Data:  White count 5.8 hemoglobin 9.5 hematocrit 30 platelet count 163   Examination: Abdominal x-ray.  INDICATION: Abdominal distention.  COMPARISON: CT from 07/13/2019.  FINDINGS:  Multiple supine portable views of the abdomen. As seen on the comparison CT, there is gaseous distention of small and large bowel. A gastrostomy tube projects over the left upper quadrant. There are surgical clips in the lower pelvis consistent with previous prostatectomy. Partially imaged changes from median sternotomy. Cardiac contour enlarged. Bilateral pleural effusions, left greater than right and basilar opacities. Limited for pneumoperitoneum given only supine views.  IMPRESSION:  Gaseous distention of  small and large bowel favoring ileus rather than distal obstruction. Correlate with clinical parameters.  Bilateral pleural effusions and basilar airspace opacities.  Electronically Signed by: Tedra Senegal, MD, [facility] Electronically Signed on: 07/16/2019 5:13 PM  Assessment/Plan  Patient Active Problem List   Diagnosis Date Noted  . Acute on chronic respiratory failure with hypoxia (HCC)   . Chronic atrial fibrillation (HCC)   . Endocarditis of prosthetic valve (HCC)   . Severe sepsis (HCC)   . COPD, severe (HCC)       1. Acute on chronic respiratory failure with hypoxia patient continues on full support mode not a weaning candidate right now patient will be continued on the current settings. 2. Ileus abdominal film noted distention of small and large bowel to favor an ileus needs decompression continue with supportive care still has significant basilar airspace disease noted also 3. Chronic atrial fibrillation rate is controlled we will continue with present management 4. Endocarditis treated we will follow 5. Severe sepsis this has resolved 6. Severe COPD at baseline continue present management   I have personally evaluated the patient, evaluated the laboratory and imaging results and formulated the assessment and plan and placed orders as needed. The Patient requires high complexity decision making with multiple system involvement. Rounds were done with the Respiratory Therapy Director and respiratory therapist involved in the care of the patient as well as nursing staff.   Yevonne Pax, MD [facility] Pulmonary Critical Care Medicine   This note is for inpatient care

## 2019-07-25 ENCOUNTER — Other Ambulatory Visit (HOSPITAL_COMMUNITY): Payer: Medicare Other | Admitting: Internal Medicine

## 2019-07-25 DIAGNOSIS — R6520 Severe sepsis without septic shock: Secondary | ICD-10-CM

## 2019-07-25 DIAGNOSIS — J9621 Acute and chronic respiratory failure with hypoxia: Secondary | ICD-10-CM

## 2019-07-25 DIAGNOSIS — J449 Chronic obstructive pulmonary disease, unspecified: Secondary | ICD-10-CM

## 2019-07-25 DIAGNOSIS — I482 Chronic atrial fibrillation, unspecified: Secondary | ICD-10-CM | POA: Diagnosis not present

## 2019-07-25 DIAGNOSIS — A419 Sepsis, unspecified organism: Secondary | ICD-10-CM

## 2019-07-25 DIAGNOSIS — I38 Endocarditis, valve unspecified: Secondary | ICD-10-CM

## 2019-07-25 DIAGNOSIS — T826XXS Infection and inflammatory reaction due to cardiac valve prosthesis, sequela: Secondary | ICD-10-CM

## 2019-07-25 NOTE — Progress Notes (Signed)
[facility]  PROGRESS NOTE  PULMONARY SERVICE ROUNDS  Date of Service: 07/25/2019  Tagg Eustice  DOB: [birth year]  Referring physician: Larena Glassman, MD  HPI: Troy Day is a 79 y.o. male  being seen for Acute on Chronic Respiratory Failure.  At this time patient is comfortable without any distress has been on pressure support and pressure control mode currently on 45% FiO2  Review of Systems: Unremarkable other than noted in HPI  Allergies:  Reviewed on the [facility]  Medications: Reviewed  Vitals: Temperature is 96.1 pulse 72 respiratory rate 20 blood pressure is 110/60 saturations 100%  Ventilator Settings: On pressure assist control FiO2 is 45% tidal volume 387 PEEP 7 inspiratory pressure 18  Physical Exam: . General:  calm and comfortable NAD . Eyes: normal lids, irises & conjunctiva . ENT: grossly normal tongue not enlarged . Neck: no masses . Cardiovascular: S1 S2 Normal no rubs no gallop . Respiratory: Good coarse breath sounds . Abdomen: soft non-distended . Skin: no rash seen on limited exam . Musculoskeletal:  no rigidity . Psychiatric: unable to assess . Neurologic: no involuntary movements          Lab Data and radiological Data:  Sodium 144 potassium 4.4 BUN 15 creatinine 0.7 White count 5.5 hemoglobin 9.3 hematocrit 31.2 platelet count 254   Assessment/Plan  Patient Active Problem List   Diagnosis Date Noted  . Acute on chronic respiratory failure with hypoxia (HCC)   . Chronic atrial fibrillation (HCC)   . Endocarditis of prosthetic valve (HCC)   . Severe sepsis (HCC)   . COPD, severe (HCC)       1. Acute on chronic respiratory failure with hypoxia we will continue with full support patient's not tolerating weaning today continue pulmonary toilet supportive care 2. Chronic atrial fibrillation rate is controlled 3. Endocarditis treated at baseline 4. Severe sepsis resolved 5. Severe COPD continue with supportive care  management   I have personally evaluated the patient, evaluated the laboratory and imaging results and formulated the assessment and plan and placed orders as needed. The Patient requires high complexity decision making with multiple system involvement. Rounds were done with the Respiratory Therapy Director and respiratory therapist involved in the care of the patient as well as nursing staff.   Yevonne Pax, MD [facility] Pulmonary Critical Care Medicine   This note is for inpatient care

## 2019-07-26 ENCOUNTER — Other Ambulatory Visit (HOSPITAL_COMMUNITY): Payer: Medicare Other | Admitting: Internal Medicine

## 2019-07-26 DIAGNOSIS — T826XXS Infection and inflammatory reaction due to cardiac valve prosthesis, sequela: Secondary | ICD-10-CM | POA: Diagnosis not present

## 2019-07-26 DIAGNOSIS — R6520 Severe sepsis without septic shock: Secondary | ICD-10-CM

## 2019-07-26 DIAGNOSIS — J449 Chronic obstructive pulmonary disease, unspecified: Secondary | ICD-10-CM

## 2019-07-26 DIAGNOSIS — A419 Sepsis, unspecified organism: Secondary | ICD-10-CM

## 2019-07-26 DIAGNOSIS — I38 Endocarditis, valve unspecified: Secondary | ICD-10-CM

## 2019-07-26 DIAGNOSIS — I482 Chronic atrial fibrillation, unspecified: Secondary | ICD-10-CM | POA: Diagnosis not present

## 2019-07-26 DIAGNOSIS — J9621 Acute and chronic respiratory failure with hypoxia: Secondary | ICD-10-CM

## 2019-07-26 NOTE — Progress Notes (Signed)
[facility]  PROGRESS NOTE  PULMONARY SERVICE ROUNDS  Date of Service: 07/26/2019  Troy Day  DOB: July 30, 1940  Referring physician: Larena Glassman, MD  HPI: Troy Day is a 79 y.o. male  being seen for Acute on Chronic Respiratory Failure.  Patient currently is on T collar has been on 50% FiO2 seems to be doing better today with sitting up in the chair out of bed  Review of Systems: Unremarkable other than noted in HPI  Allergies:  Reviewed on the [facility]  Medications: Reviewed  Vitals: Temperature 96.7 pulse 64 respiratory rate 20 blood pressure is 120/64 saturations 97%  Ventilator Settings: On T collar with an FiO2 of 50%  Physical Exam: . General:  calm and comfortable NAD . Eyes: normal lids, irises & conjunctiva . ENT: grossly normal tongue not enlarged . Neck: no masses . Cardiovascular: S1 S2 Normal no rubs no gallop . Respiratory: No rhonchi coarse breath sounds . Abdomen: soft non-distended . Skin: no rash seen on limited exam . Musculoskeletal:  no rigidity . Psychiatric: unable to assess . Neurologic: no involuntary movements          Lab Data and radiological Data:  No labs today   Assessment/Plan  Patient Active Problem List   Diagnosis Date Noted  . Acute on chronic respiratory failure with hypoxia (HCC)   . Chronic atrial fibrillation (HCC)   . Endocarditis of prosthetic valve (HCC)   . Severe sepsis (HCC)   . COPD, severe (HCC)       1. Acute on chronic respiratory failure with hypoxia plan is to continue with T collar as tolerated as discussed during rounds. 2. Chronic atrial fibrillation rate is controlled at this time 3. Endocarditis treated 4. Severe sepsis hemodynamics are stable 5. Severe COPD at baseline we will continue with present management   I have personally evaluated the patient, evaluated the laboratory and imaging results and formulated the assessment and plan and placed orders as needed. The  Patient requires high complexity decision making with multiple system involvement. Rounds were done with the Respiratory Therapy Director and respiratory therapist involved in the care of the patient as well as nursing staff.   Yevonne Pax, MD [facility] Pulmonary Critical Care Medicine   This note is for inpatient care

## 2019-07-27 ENCOUNTER — Other Ambulatory Visit (HOSPITAL_COMMUNITY): Payer: Medicare Other | Admitting: Internal Medicine

## 2019-07-27 DIAGNOSIS — J449 Chronic obstructive pulmonary disease, unspecified: Secondary | ICD-10-CM

## 2019-07-27 DIAGNOSIS — I482 Chronic atrial fibrillation, unspecified: Secondary | ICD-10-CM

## 2019-07-27 DIAGNOSIS — T826XXS Infection and inflammatory reaction due to cardiac valve prosthesis, sequela: Secondary | ICD-10-CM | POA: Diagnosis not present

## 2019-07-27 DIAGNOSIS — I38 Endocarditis, valve unspecified: Secondary | ICD-10-CM

## 2019-07-27 DIAGNOSIS — A419 Sepsis, unspecified organism: Secondary | ICD-10-CM

## 2019-07-27 DIAGNOSIS — R6520 Severe sepsis without septic shock: Secondary | ICD-10-CM

## 2019-07-27 DIAGNOSIS — J9621 Acute and chronic respiratory failure with hypoxia: Secondary | ICD-10-CM | POA: Diagnosis not present

## 2019-07-27 NOTE — Progress Notes (Signed)
[facility]  PROGRESS NOTE  PULMONARY SERVICE ROUNDS  Date of Service: 07/27/2019  Troy Day  DOB: [birth year]  Referring physician: Larena Glassman, MD  HPI: Troy Day is a 79 y.o. male  being seen for Acute on Chronic Respiratory Failure.  Patient currently is on T collar and was able to do about 2 hours of T collar yesterday in today was attempted back on pressure support of 40%  Review of Systems: Unremarkable other than noted in HPI  Allergies:  Reviewed on the [facility]  Medications: Reviewed  Vitals: Temperature 96.5 pulse 66 respiratory rate 20 blood pressure is 156 saturations 99%  Ventilator Settings: After pressure support 5/5 T collar trials  Physical Exam: . General:  calm and comfortable NAD . Eyes: normal lids, irises & conjunctiva . ENT: grossly normal tongue not enlarged . Neck: no masses . Cardiovascular: S1 S2 Normal no rubs no gallop . Respiratory: No rhonchi no rales . Abdomen: soft non-distended . Skin: no rash seen on limited exam . Musculoskeletal:  no rigidity . Psychiatric: unable to assess . Neurologic: no involuntary movements          Lab Data and radiological Data:  Sodium 141 potassium 4.3 BUN 19 creatinine 0.6 White count 4.1 hemoglobin 8.1 hematocrit 27.4 platelet count 230   Assessment/Plan  Patient Active Problem List   Diagnosis Date Noted  . Acute on chronic respiratory failure with hypoxia (HCC)   . Chronic atrial fibrillation (HCC)   . Endocarditis of prosthetic valve (HCC)   . Severe sepsis (HCC)   . COPD, severe (HCC)       1. Acute on chronic respiratory failure with hypoxia wean on T collar as tolerated continue secretion management supportive care. 2. Chronic atrial fibrillation rate is controlled 3. Endocarditis treated 4. Severe sepsis resolved 5. Severe COPD at baseline continue with present management   I have personally evaluated the patient, evaluated the laboratory and imaging  results and formulated the assessment and plan and placed orders as needed. The Patient requires high complexity decision making with multiple system involvement. Rounds were done with the Respiratory Therapy Director and respiratory therapist involved in the care of the patient as well as nursing staff.   Yevonne Pax, MD [facility] Pulmonary Critical Care Medicine   This note is for inpatient care

## 2019-07-28 ENCOUNTER — Other Ambulatory Visit (HOSPITAL_COMMUNITY): Payer: Medicare Other | Admitting: Internal Medicine

## 2019-07-28 DIAGNOSIS — J9621 Acute and chronic respiratory failure with hypoxia: Secondary | ICD-10-CM

## 2019-07-28 DIAGNOSIS — R6520 Severe sepsis without septic shock: Secondary | ICD-10-CM

## 2019-07-28 DIAGNOSIS — I38 Endocarditis, valve unspecified: Secondary | ICD-10-CM

## 2019-07-28 DIAGNOSIS — I482 Chronic atrial fibrillation, unspecified: Secondary | ICD-10-CM | POA: Diagnosis not present

## 2019-07-28 DIAGNOSIS — T826XXS Infection and inflammatory reaction due to cardiac valve prosthesis, sequela: Secondary | ICD-10-CM | POA: Diagnosis not present

## 2019-07-28 DIAGNOSIS — A419 Sepsis, unspecified organism: Secondary | ICD-10-CM

## 2019-07-28 DIAGNOSIS — J449 Chronic obstructive pulmonary disease, unspecified: Secondary | ICD-10-CM | POA: Diagnosis not present

## 2019-07-28 NOTE — Progress Notes (Signed)
[facility]  PROGRESS NOTE  PULMONARY SERVICE ROUNDS  Date of Service: 07/28/2019  Troy Day  DOB: Mar 03, 1941  Referring physician: Larena Glassman, MD  HPI: Troy Day is a 79 y.o. male  being seen for Acute on Chronic Respiratory Failure.  Patient was attempted on spontaneous breathing trial which the patient failed placed back on pressure assist control mode.  Right now is requiring 40% FiO2 with a PEEP of 5  Review of Systems: Unremarkable other than noted in HPI  Allergies:  Reviewed on the MAR  Medications: Reviewed  Vitals: Temperature is 97.5 pulse 68 respiratory rate 20 blood pressure is 120/68 saturations are 99%  Ventilator Settings: On pressure assist control FiO2 40% tidal volume 503 with a PEEP of 5 IP is 18  Physical Exam: . General:  calm and comfortable NAD . Eyes: normal lids, irises & conjunctiva . ENT: grossly normal tongue not enlarged . Neck: no masses . Cardiovascular: S1 S2 Normal no rubs no gallop . Respiratory: No rhonchi coarse breath sounds are noted . Abdomen: soft non-distended . Skin: no rash seen on limited exam . Musculoskeletal:  no rigidity . Psychiatric: unable to assess . Neurologic: no involuntary movements          Lab Data and radiological Data:  Data reviewed   Assessment/Plan  Patient Active Problem List   Diagnosis Date Noted  . Acute on chronic respiratory failure with hypoxia (HCC)   . Chronic atrial fibrillation (HCC)   . Endocarditis of prosthetic valve (HCC)   . Severe sepsis (HCC)   . COPD, severe (HCC)       1. Acute on chronic respiratory failure with hypoxia we will continue with full support on the ventilator patient is failing weaning attempts at this time.  In the past he has had issues with CHF would recommend doing a follow-up chest x-ray. 2. Chronic atrial fibrillation rate is controlled at this time we will continue with supportive care 3. Endocarditis at baseline  treated 4. Severe sepsis resolved 5. Severe COPD at baseline we will continue to follow along   I have personally evaluated the patient, evaluated the laboratory and imaging results and formulated the assessment and plan and placed orders as needed. The Patient requires high complexity decision making with multiple system involvement. Rounds were done with the Respiratory Therapy Director and respiratory therapist involved in the care of the patient as well as nursing staff.   Yevonne Pax, MD [facility] Pulmonary Critical Care Medicine   This note is for inpatient care

## 2019-07-29 ENCOUNTER — Other Ambulatory Visit (HOSPITAL_COMMUNITY): Payer: Medicare Other | Admitting: Internal Medicine

## 2019-07-29 DIAGNOSIS — T826XXS Infection and inflammatory reaction due to cardiac valve prosthesis, sequela: Secondary | ICD-10-CM | POA: Diagnosis not present

## 2019-07-29 DIAGNOSIS — I482 Chronic atrial fibrillation, unspecified: Secondary | ICD-10-CM

## 2019-07-29 DIAGNOSIS — I38 Endocarditis, valve unspecified: Secondary | ICD-10-CM

## 2019-07-29 DIAGNOSIS — J9621 Acute and chronic respiratory failure with hypoxia: Secondary | ICD-10-CM

## 2019-07-29 DIAGNOSIS — A419 Sepsis, unspecified organism: Secondary | ICD-10-CM

## 2019-07-29 DIAGNOSIS — J449 Chronic obstructive pulmonary disease, unspecified: Secondary | ICD-10-CM

## 2019-07-29 DIAGNOSIS — R6520 Severe sepsis without septic shock: Secondary | ICD-10-CM

## 2019-07-29 NOTE — Progress Notes (Signed)
[facility]  PROGRESS NOTE  PULMONARY SERVICE ROUNDS  Date of Service: 07/29/2019  Troy Day  DOB: [birth year]  Referring physician: Larena Glassman, MD  HPI: Troy Day is a 79 y.o. male  being seen for Acute on Chronic Respiratory Failure.  Patient is currently on the ventilator on pressure control mode.  Apparently Lasix was discontinued last week and patient is actually quite sensitive to diuresis.  He has since then essentially been failing on weaning attempts.  Spoke with the primary care team as well during multidisciplinary rounds and they are going to resume the patient's Lasix  Review of Systems: Unremarkable other than noted in HPI  Allergies:  Reviewed on the [facility]  Medications: Reviewed  Vitals: Temperature 97.0 pulse 72 respiratory rate 22 blood pressure is 101/50 saturations 99%  Ventilator Settings: On pressure assist control FiO2 is 45% tidal line 393 PEEP 5 IP 20  Physical Exam: . General:  calm and comfortable NAD . Eyes: normal lids, irises & conjunctiva . ENT: grossly normal tongue not enlarged . Neck: no masses . Cardiovascular: S1 S2 Normal no rubs no gallop . Respiratory: Scattered rhonchi expansion is equal . Abdomen: soft non-distended . Skin: no rash seen on limited exam . Musculoskeletal:  no rigidity . Psychiatric: unable to assess . Neurologic: no involuntary movements          Lab Data and radiological Data:  Sodium 139 potassium 4.2 BUN is 20 creatinine 0.6 White count 4.3 hemoglobin 8.5 hematocrit 28.3 platelet count 256   Assessment/Plan  Patient Active Problem List   Diagnosis Date Noted  . Acute on chronic respiratory failure with hypoxia (HCC)   . Chronic atrial fibrillation (HCC)   . Endocarditis of prosthetic valve (HCC)   . Severe sepsis (HCC)   . COPD, severe (HCC)       1. Acute on chronic respiratory failure with hypoxia patient continues full support on the ventilator the diuretics will be  restarted and hopefully once this is done we will have better chance of resuming the wean.  This was discussed during multidisciplinary round 2. Chronic atrial fibrillation rate is controlled we will continue to monitor along closely. 3. Endocarditis has been treated we will continue with supportive care 4. Severe sepsis resolved 5. Severe COPD at baseline we will continue with present management.   I have personally evaluated the patient, evaluated the laboratory and imaging results and formulated the assessment and plan and placed orders as needed. The Patient requires high complexity decision making with multiple system involvement. Rounds were done with the Respiratory Therapy Director and respiratory therapist involved in the care of the patient as well as nursing staff.   Yevonne Pax, MD [facility] Pulmonary Critical Care Medicine   This note is for inpatient care

## 2019-07-30 ENCOUNTER — Other Ambulatory Visit (HOSPITAL_COMMUNITY): Payer: Medicare Other | Admitting: Internal Medicine

## 2019-07-30 DIAGNOSIS — T826XXS Infection and inflammatory reaction due to cardiac valve prosthesis, sequela: Secondary | ICD-10-CM | POA: Diagnosis not present

## 2019-07-30 DIAGNOSIS — I482 Chronic atrial fibrillation, unspecified: Secondary | ICD-10-CM | POA: Diagnosis not present

## 2019-07-30 DIAGNOSIS — J9621 Acute and chronic respiratory failure with hypoxia: Secondary | ICD-10-CM

## 2019-07-30 DIAGNOSIS — J449 Chronic obstructive pulmonary disease, unspecified: Secondary | ICD-10-CM | POA: Diagnosis not present

## 2019-07-30 DIAGNOSIS — I38 Endocarditis, valve unspecified: Secondary | ICD-10-CM

## 2019-07-30 DIAGNOSIS — R6520 Severe sepsis without septic shock: Secondary | ICD-10-CM

## 2019-07-30 DIAGNOSIS — A419 Sepsis, unspecified organism: Secondary | ICD-10-CM

## 2019-07-30 NOTE — Progress Notes (Signed)
[facility]  PROGRESS NOTE  PULMONARY SERVICE ROUNDS  Date of Service: 07/30/2019  Troy Day  DOB: 12/10/1940  Referring physician: Larena Glassman, MD  HPI: Troy Day is a 79 y.o. male  being seen for Acute on Chronic Respiratory Failure.  Patient at this time is ventilator right now control appears to be without any distress.  With the primary care team they are going to continue more aggressive diuresis also had some fluid so patient will probably get a thoracentesis also  Review of Systems: Unremarkable other than noted in HPI  Allergies:  Reviewed on the [facility]  Medications: Reviewed  Vitals: Temperature 97.1 pulse 70 respiratory 15 blood pressure is 115/60 saturations 100%  Ventilator Settings: On pressure assist control FiO2 is 40% tidal volume 484 PEEP 5 inspiratory pressures 20  Physical Exam: . General:  calm and comfortable NAD . Eyes: normal lids, irises & conjunctiva . ENT: grossly normal tongue not enlarged . Neck: no masses . Cardiovascular: S1 S2 Normal no rubs no gallop . Respiratory: No rhonchi coarse breath sounds . Abdomen: soft non-distended . Skin: no rash seen on limited exam . Musculoskeletal:  no rigidity . Psychiatric: unable to assess . Neurologic: no involuntary movements          Lab Data and radiological Data:  Sodium 138 potassium 3.7 BUN 19 creatinine 0.7   Assessment/Plan  Patient Active Problem List   Diagnosis Date Noted  . Acute on chronic respiratory failure with hypoxia (HCC)   . Chronic atrial fibrillation (HCC)   . Endocarditis of prosthetic valve (HCC)   . Severe sepsis (HCC)   . COPD, severe (HCC)       1. Acute on chronic respiratory failure with hypoxia right now on pressure control hopefully after the patient has thoracentesis and more aggressive diuresis we will look at trying to resume the weaning. 2. Chronic atrial fibrillation right now is rate controlled 3. Endocarditis no change  supportive care 4. Severe sepsis resolved 5. Severe COPD patient is at baseline   I have personally evaluated the patient, evaluated the laboratory and imaging results and formulated the assessment and plan and placed orders as needed. The Patient requires high complexity decision making with multiple system involvement. Rounds were done with the Respiratory Therapy Director and respiratory therapist involved in the care of the patient as well as nursing staff.   Yevonne Pax, MD [facility] Pulmonary Critical Care Medicine   This note is for inpatient care

## 2019-07-31 ENCOUNTER — Other Ambulatory Visit (HOSPITAL_COMMUNITY): Payer: Medicare Other | Admitting: Internal Medicine

## 2019-07-31 DIAGNOSIS — T826XXS Infection and inflammatory reaction due to cardiac valve prosthesis, sequela: Secondary | ICD-10-CM | POA: Diagnosis not present

## 2019-07-31 DIAGNOSIS — J9621 Acute and chronic respiratory failure with hypoxia: Secondary | ICD-10-CM | POA: Diagnosis not present

## 2019-07-31 DIAGNOSIS — I482 Chronic atrial fibrillation, unspecified: Secondary | ICD-10-CM | POA: Diagnosis not present

## 2019-07-31 DIAGNOSIS — R6520 Severe sepsis without septic shock: Secondary | ICD-10-CM

## 2019-07-31 DIAGNOSIS — I38 Endocarditis, valve unspecified: Secondary | ICD-10-CM

## 2019-07-31 DIAGNOSIS — A419 Sepsis, unspecified organism: Secondary | ICD-10-CM

## 2019-07-31 DIAGNOSIS — J449 Chronic obstructive pulmonary disease, unspecified: Secondary | ICD-10-CM | POA: Diagnosis not present

## 2019-07-31 NOTE — Progress Notes (Signed)
[facility]  PROGRESS NOTE  PULMONARY SERVICE ROUNDS  Date of Service: 07/31/2019  Troy Day  DOB: 06-Dec-1940  Referring physician: Larena Glassman, MD  HPI: Troy Day is a 79 y.o. male  being seen for Acute on Chronic Respiratory Failure.  Patient underwent thoracentesis today had like 1100 cc of fluid removed tolerated the procedure rather well.  Hopefully after the fluid removal we should be seen improved mechanics and should once again try to wean  Review of Systems: Unremarkable other than noted in HPI  Allergies:  Reviewed on the [facility]  Medications: Reviewed  Vitals: Temperature is 97.2 pulse 68 respiratory rate 23 blood pressures 95/45 saturations 100%  Ventilator Settings: Mode of ventilation pressure assist control FiO2 40% tidal volume 393 PEEP 5  Physical Exam: . General:  calm and comfortable NAD . Eyes: normal lids, irises & conjunctiva . ENT: grossly normal tongue not enlarged . Neck: no masses . Cardiovascular: S1 S2 Normal no rubs no gallop . Respiratory: Scattered rhonchi expansion is equal . Abdomen: soft non-distended . Skin: no rash seen on limited exam . Musculoskeletal:  no rigidity . Psychiatric: unable to assess . Neurologic: no involuntary movements          Lab Data and radiological Data:  *HISTORY: Respiratory failure. Bilateral pleural effusions, left greater than right.  TECHNIQUE/PROCEDURE: The indications, procedural details, and possible complications of ultrasound guided thoracentesis were explained in detail to the patient. The patient asked appropriate questions. A consent form was signed.  Ultrasound examination of the thorax confirmed the pleural effusion. An appropriate site for thoracentesis was determined.  Under sterile conditions, local Lidocaine anesthesia, and ultrasound guidance, a thoracentesis was performed on the left side. 1100 mL of serosanguineous fluid was obtained and sent  to the laboratory for cell count, protein, glucose, cytology, and culture.  pH was 7.50  The patient tolerated the procedure well.  A chest radiograph was arranged and will be reported separately.  IMPRESSION: Successful thoracentesis under ultrasound guidance.  Electronically Signed by: Vevelyn Pat, MD, [facility] Radiology Electronically Signed on: 07/31/2019 1:22 PM   Assessment/Plan  Patient Active Problem List   Diagnosis Date Noted  . Acute on chronic respiratory failure with hypoxia (HCC)   . Chronic atrial fibrillation (HCC)   . Endocarditis of prosthetic valve (HCC)   . Severe sepsis (HCC)   . COPD, severe (HCC)       1. Acute on chronic respiratory failure with hypoxia patient is on the ventilator right now full support the plan is to reassess the RSB I and the mechanics and try to wean since the patient had thoracentesis.  In addition aggressive diuresis will hopefully help further to wean 2. Chronic atrial fibrillation rate is controlled 3. Endocarditis treated 4. Severe sepsis hemodynamics are stable 5. Severe COPD nebulizers as necessary we will continue with supportive care   I have personally evaluated the patient, evaluated the laboratory and imaging results and formulated the assessment and plan and placed orders as needed. The Patient requires high complexity decision making with multiple system involvement. Rounds were done with the Respiratory Therapy Director and respiratory therapist involved in the care of the patient as well as nursing staff.  Time 35 minutes   Yevonne Pax, MD [facility] Pulmonary Critical Care Medicine   This note is for inpatient care

## 2019-08-01 ENCOUNTER — Other Ambulatory Visit (HOSPITAL_COMMUNITY): Payer: Medicare Other | Admitting: Internal Medicine

## 2019-08-01 DIAGNOSIS — R6520 Severe sepsis without septic shock: Secondary | ICD-10-CM

## 2019-08-01 DIAGNOSIS — J449 Chronic obstructive pulmonary disease, unspecified: Secondary | ICD-10-CM | POA: Diagnosis not present

## 2019-08-01 DIAGNOSIS — I482 Chronic atrial fibrillation, unspecified: Secondary | ICD-10-CM | POA: Diagnosis not present

## 2019-08-01 DIAGNOSIS — J9621 Acute and chronic respiratory failure with hypoxia: Secondary | ICD-10-CM | POA: Diagnosis not present

## 2019-08-01 DIAGNOSIS — T826XXS Infection and inflammatory reaction due to cardiac valve prosthesis, sequela: Secondary | ICD-10-CM | POA: Diagnosis not present

## 2019-08-01 DIAGNOSIS — A419 Sepsis, unspecified organism: Secondary | ICD-10-CM

## 2019-08-01 DIAGNOSIS — I38 Endocarditis, valve unspecified: Secondary | ICD-10-CM

## 2019-08-01 NOTE — Progress Notes (Signed)
[facility]  PROGRESS NOTE  PULMONARY SERVICE ROUNDS  Date of Service: 08/01/2019  Troy Day  DOB: Feb 13, 1941  Referring physician: Larena Glassman, MD  HPI: Troy Day is a 79 y.o. male  being seen for Acute on Chronic Respiratory Failure.  Patient currently is having some increased anxiety issues but was more agitated than normal.  Remains on the ventilator.  Rehab nursing staff reports that he was definitely more confused than he has been  Review of Systems: Unremarkable other than noted in HPI  Allergies:  Reviewed on the [facility]  Medications: Reviewed  Vitals: Temperature is 98.4 pulse 88 respiratory rate 21 blood pressure is 110/65 saturations 100%  Ventilator Settings: On pressure assist control FiO2 40% tidal volume 468 PEEP 5  Physical Exam: . General:  calm and comfortable NAD . Eyes: normal lids, irises & conjunctiva . ENT: grossly normal tongue not enlarged . Neck: no masses . Cardiovascular: S1 S2 Normal no rubs no gallop . Respiratory: No rhonchi no rales are noted at this time . Abdomen: soft non-distended . Skin: no rash seen on limited exam . Musculoskeletal:  no rigidity . Psychiatric: unable to assess . Neurologic: no involuntary movements          Lab Data and radiological Data:  Sodium 137 potassium is 4.0 BUN is 18 creatinine 0.6 White count 4.1 hemoglobin 8.5 hematocrit 27.4 platelet count 238 ABG pH 7.52 PCO2 48 PO2 107   Assessment/Plan  Patient Active Problem List   Diagnosis Date Noted  . Acute on chronic respiratory failure with hypoxia (HCC)   . Chronic atrial fibrillation (HCC)   . Endocarditis of prosthetic valve (HCC)   . Severe sepsis (HCC)   . COPD, severe (HCC)       1. Acute on chronic respiratory failure with hypoxia plan is to continue with the ventilator full support the ABG looks okay other than the alkalosis this noted.  We will hold off on trying to wean today 2. Chronic atrial fibrillation  rate is controlled 3. Endocarditis treated 4. Severe sepsis treated resolved 5. Severe COPD at baseline we will continue with present management   I have personally evaluated the patient, evaluated the laboratory and imaging results and formulated the assessment and plan and placed orders as needed. The Patient requires high complexity decision making with multiple system involvement. Rounds were done with the Respiratory Therapy Director and respiratory therapist involved in the care of the patient as well as nursing staff.   Yevonne Pax, MD [facility] Pulmonary Critical Care Medicine   This note is for inpatient care

## 2019-08-03 ENCOUNTER — Other Ambulatory Visit (HOSPITAL_COMMUNITY): Payer: Medicare Other | Admitting: Internal Medicine

## 2019-08-03 DIAGNOSIS — A419 Sepsis, unspecified organism: Secondary | ICD-10-CM

## 2019-08-03 DIAGNOSIS — J9621 Acute and chronic respiratory failure with hypoxia: Secondary | ICD-10-CM | POA: Diagnosis not present

## 2019-08-03 DIAGNOSIS — J449 Chronic obstructive pulmonary disease, unspecified: Secondary | ICD-10-CM | POA: Diagnosis not present

## 2019-08-03 DIAGNOSIS — R6520 Severe sepsis without septic shock: Secondary | ICD-10-CM

## 2019-08-03 DIAGNOSIS — I38 Endocarditis, valve unspecified: Secondary | ICD-10-CM

## 2019-08-03 DIAGNOSIS — T826XXS Infection and inflammatory reaction due to cardiac valve prosthesis, sequela: Secondary | ICD-10-CM | POA: Diagnosis not present

## 2019-08-03 DIAGNOSIS — I482 Chronic atrial fibrillation, unspecified: Secondary | ICD-10-CM | POA: Diagnosis not present
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
## 2019-08-04 ENCOUNTER — Other Ambulatory Visit (HOSPITAL_COMMUNITY): Payer: Medicare Other | Admitting: Internal Medicine

## 2019-08-04 DIAGNOSIS — J449 Chronic obstructive pulmonary disease, unspecified: Secondary | ICD-10-CM | POA: Diagnosis not present

## 2019-08-04 DIAGNOSIS — A419 Sepsis, unspecified organism: Secondary | ICD-10-CM

## 2019-08-04 DIAGNOSIS — J9621 Acute and chronic respiratory failure with hypoxia: Secondary | ICD-10-CM | POA: Diagnosis not present

## 2019-08-04 DIAGNOSIS — I482 Chronic atrial fibrillation, unspecified: Secondary | ICD-10-CM

## 2019-08-04 DIAGNOSIS — T826XXS Infection and inflammatory reaction due to cardiac valve prosthesis, sequela: Secondary | ICD-10-CM | POA: Diagnosis not present

## 2019-08-04 DIAGNOSIS — I38 Endocarditis, valve unspecified: Secondary | ICD-10-CM

## 2019-08-04 DIAGNOSIS — R6520 Severe sepsis without septic shock: Secondary | ICD-10-CM

## 2019-08-04 NOTE — Progress Notes (Signed)
[facility]  PROGRESS NOTE  PULMONARY SERVICE ROUNDS  Date of Service: 08/04/2019  Troy Day  DOB: 1940-10-13  Referring physician: Larena Glassman, MD  HPI: Troy Day is a 79 y.o. male  being seen for Acute on Chronic Respiratory Failure.  He is weaning on T collar requiring 40% FiO2 possible discharge in the morning has been off of restraints  Review of Systems: Unremarkable other than noted in HPI  Allergies:  Reviewed on the [facility]  Medications: Reviewed  Vitals: Temperature is 97.1 pulse 70 respiratory 29 blood pressure is 110/50 saturations 100%  Ventilator Settings: On T collar with an FiO2 of 40%  Physical Exam: . General:  calm and comfortable NAD . Eyes: normal lids, irises & conjunctiva . ENT: grossly normal tongue not enlarged . Neck: no masses . Cardiovascular: S1 S2 Normal no rubs no gallop . Respiratory: Scattered rhonchi expansion is equal . Abdomen: soft non-distended . Skin: no rash seen on limited exam . Musculoskeletal:  no rigidity . Psychiatric: unable to assess . Neurologic: no involuntary movements          Lab Data and radiological Data:  No labs to report   Assessment/Plan  Patient Active Problem List   Diagnosis Date Noted  . Acute on chronic respiratory failure with hypoxia (HCC)   . Chronic atrial fibrillation (HCC)   . Endocarditis of prosthetic valve (HCC)   . Severe sepsis (HCC)   . COPD, severe (HCC)       1. Acute on chronic respiratory failure hypoxia continue T collar for now plan is for discharge to skilled nursing tomorrow 2. Chronic atrial fibrillation rate controlled 3. Endocarditis treated 4. Severe sepsis resolved hemodynamics are stable 5. Severe COPD nebulizers as needed   I have personally evaluated the patient, evaluated the laboratory and imaging results and formulated the assessment and plan and placed orders as needed. The Patient requires high complexity decision making with  multiple system involvement. Rounds were done with the Respiratory Therapy Director and respiratory therapist involved in the care of the patient as well as nursing staff.   Yevonne Pax, MD [facility] Pulmonary Critical Care Medicine   This note is for inpatient care

## 2019-09-18 ENCOUNTER — Other Ambulatory Visit (HOSPITAL_COMMUNITY): Payer: Self-pay

## 2019-09-18 DIAGNOSIS — R059 Cough, unspecified: Secondary | ICD-10-CM

## 2019-09-18 DIAGNOSIS — R05 Cough: Secondary | ICD-10-CM

## 2019-09-18 DIAGNOSIS — R1310 Dysphagia, unspecified: Secondary | ICD-10-CM

## 2019-09-25 ENCOUNTER — Other Ambulatory Visit: Payer: Self-pay

## 2019-09-25 ENCOUNTER — Ambulatory Visit (HOSPITAL_COMMUNITY)
Admission: RE | Admit: 2019-09-25 | Discharge: 2019-09-25 | Disposition: A | Discharge status: Home / Self Care | Payer: 59 | Source: Ambulatory Visit | Attending: Internal Medicine | Admitting: Internal Medicine

## 2019-09-25 DIAGNOSIS — R1310 Dysphagia, unspecified: Secondary | ICD-10-CM | POA: Diagnosis present

## 2019-09-25 DIAGNOSIS — R05 Cough: Secondary | ICD-10-CM | POA: Diagnosis present

## 2019-09-25 DIAGNOSIS — R059 Cough, unspecified: Secondary | ICD-10-CM

## 2019-09-25 NOTE — Progress Notes (Signed)
Modified Barium Swallow Progress Note  Patient Details  Name: Troy Day MRN: [id number] Date of Birth: 05-Jul-1940  Today's Date: 09/25/2019  Modified Barium Swallow completed.  Full report located under Chart Review in the Imaging Section.  Brief recommendations include the following:  Clinical Impression  Pt demonstrates a moderate to severe pharyngeal dysphagia due to sensory deficits and possible decreased laryngeal closure. Pt had intermittent instances of slight premature spillage and late/incomplete laryngeal closure with thin and nectar thick liquids during all methods of intake and positional strategies that resulted in silent aspiration. When drinking consecutively with a straw with a head turn left, pt typically had timley laryngeal closure, but trace silent aspiration still occurred, appearing due to incomplete glottic closure.  In short, there were no compensatory strategies that consistently eliminated aspiration (including head turn R/L, chin tuck, breath hold, small sips with brief oral hold) with thin and nectar thick liquids. Pt was 100% successful with self fed sips of honey thick liquids, puree and regular solids though a liquid wash assisted in mild vallecular residue. A super supraglottic swallow may be beneficial to practice in therapy though repeat instrumental testing may be needed prior to upgrade. Discussed with primary SLP.    Swallow Evaluation Recommendations       SLP Diet Recommendations: Regular solids;Honey thick liquids   Liquid Administration via: Cup   Medication Administration: Whole meds with puree   Supervision: Patient able to self feed   Compensations: Slow rate;Small sips/bites;Clear throat intermittently   Postural Changes: Remain semi-upright after after feeds/meals (Comment)            Tahisha Hakim, Riley Nearing 09/25/2019,2:05 PM

## 2020-07-29 IMAGING — RF DG SWALLOWING FUNCTION
12 of 24 series · 12 of 24 positions shown · non-contrast
Comparison: None.

CLINICAL DATA: Dysphagia following cardiac surgery

EXAM:
MODIFIED BARIUM SWALLOW
TECHNIQUE: Different consistencies of barium were administered orally to the
patient by the Speech Pathologist. Imaging of the pharynx was
performed in the lateral projection. The radiologist was present in
the fluoroscopy room for this study, providing personal supervision.
FLUOROSCOPY TIME:  Fluoroscopy Time:  4 minutes 28 seconds
Radiation Exposure Index (if provided by the fluoroscopic device):
Not applicable
Number of Acquired Spot Images: 0

[Series 2: run · 1 of 85 frames shown (1 of 12)]
[frame 13/85]
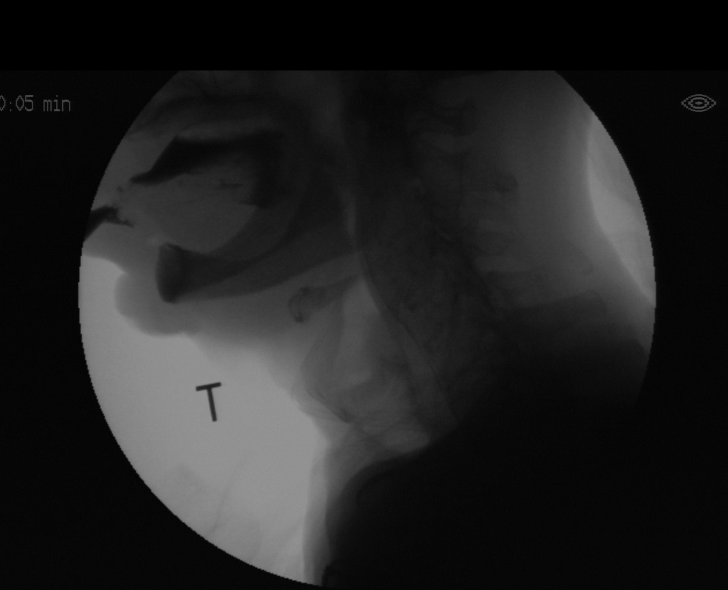

[Series 4: run · 1 of 254 frames shown (2 of 12)]
[frame 39/254]
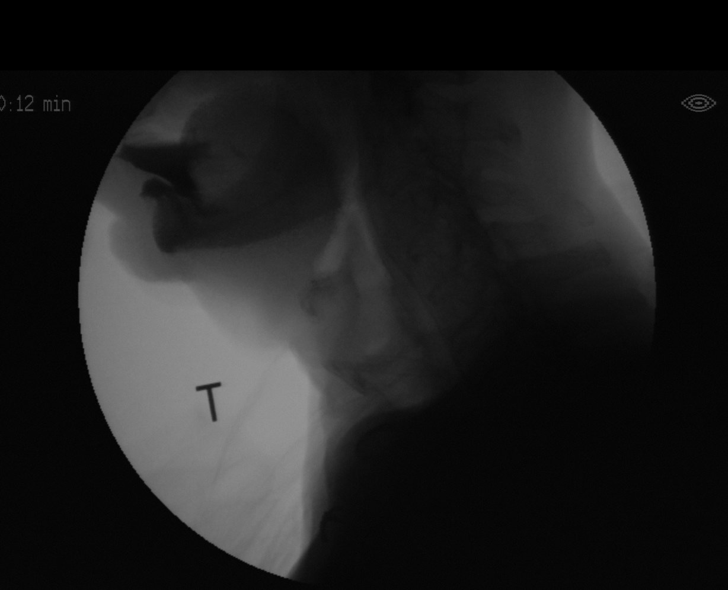

[Series 6: run · 1 of 198 frames shown (3 of 12)]
[frame 17/198]
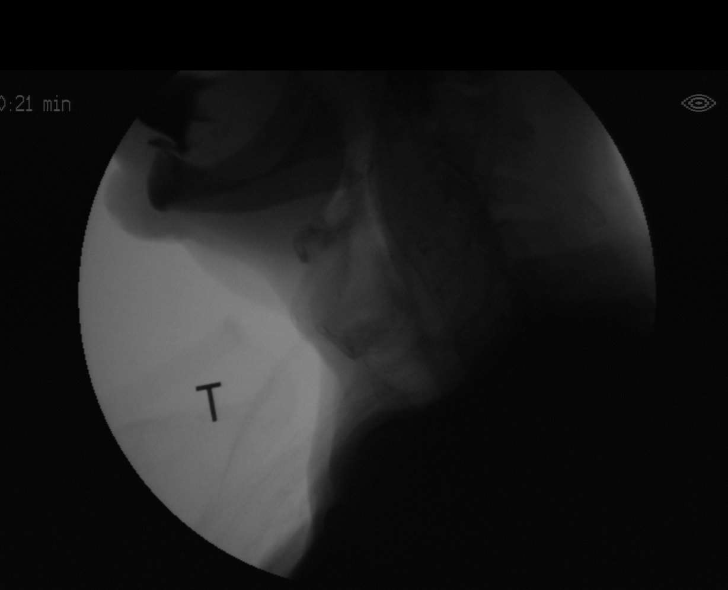

[Series 8: run · 1 of 271 frames shown (4 of 12)]
[frame 52/271]
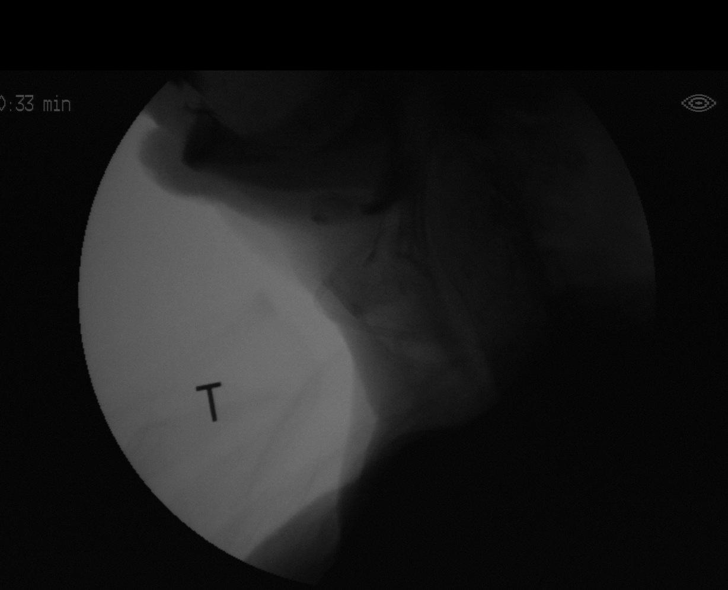

[Series 10: run · 1 of 173 frames shown (5 of 12)]
[frame 45/173]
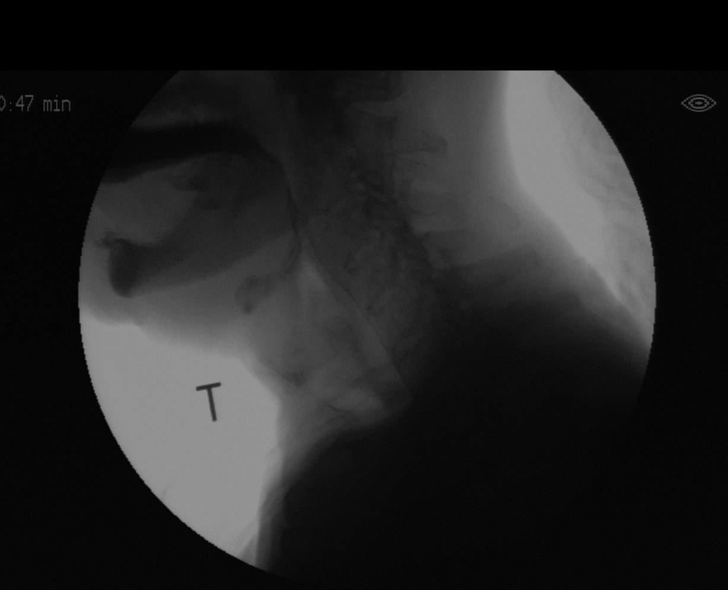

[Series 12: run · 1 of 170 frames shown (6 of 12)]
[frame 26/170]
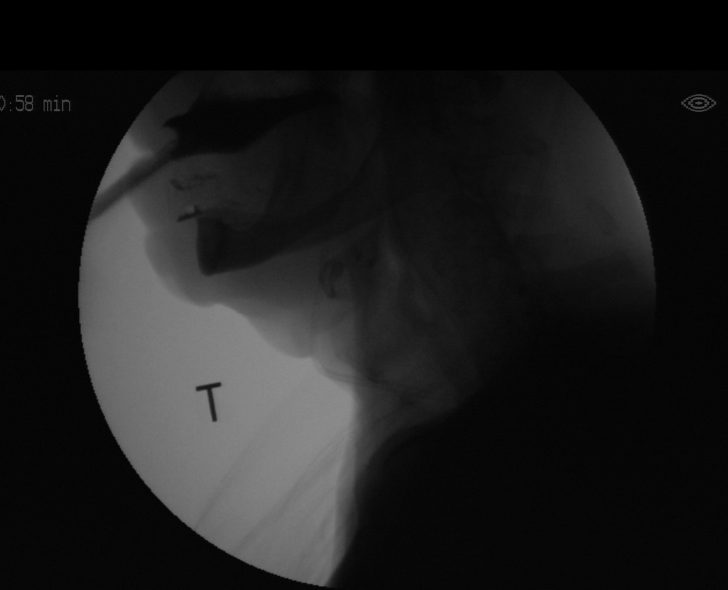

[Series 14: run · 1 of 152 frames shown (7 of 12)]
[frame 34/152]
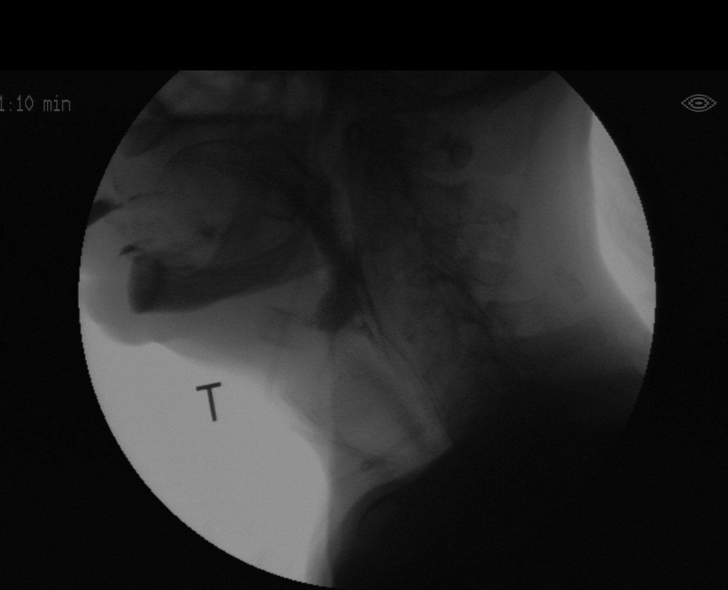

[Series 16: run · 1 of 232 frames shown (8 of 12)]
[frame 117/232]
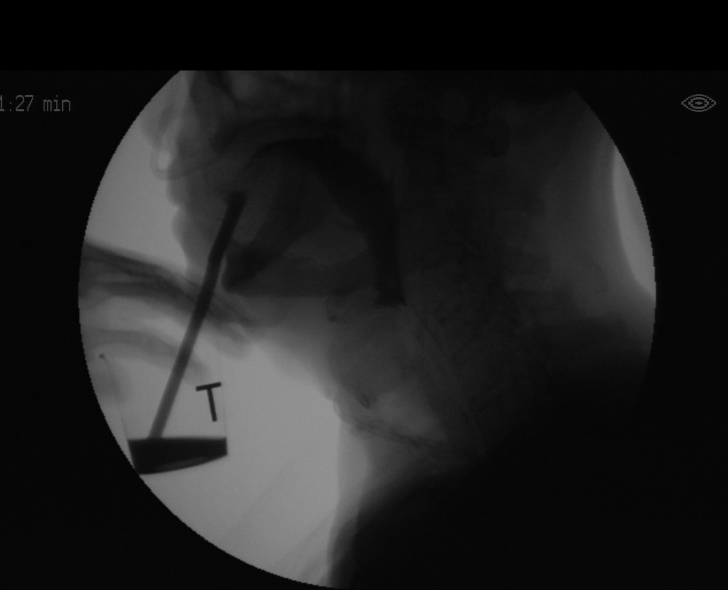

[Series 18: run · 1 of 294 frames shown (9 of 12)]
[frame 45/294]
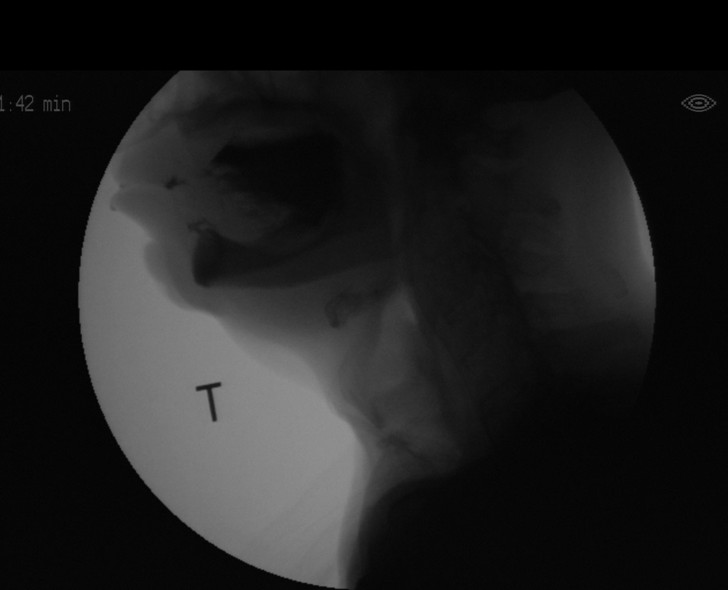

[Series 20: run · 1 of 322 frames shown (10 of 12)]
[frame 162/322]
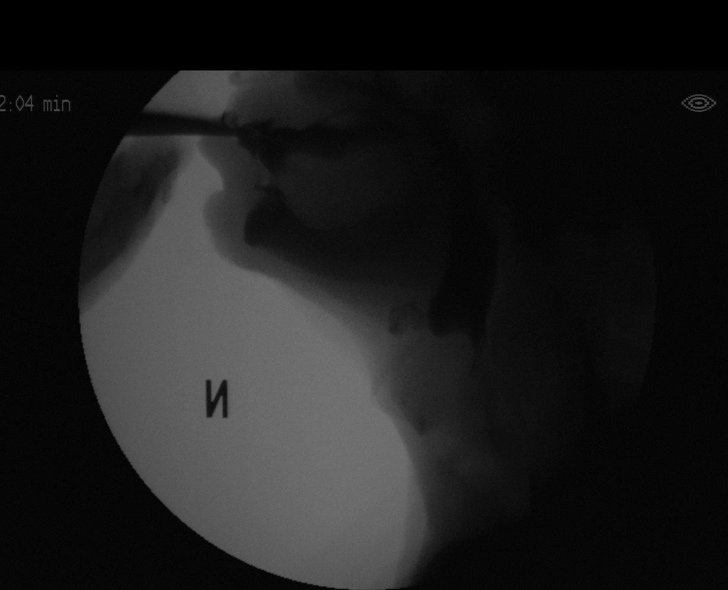

[Series 22: run · 1 of 182 frames shown (11 of 12)]
[frame 155/182]
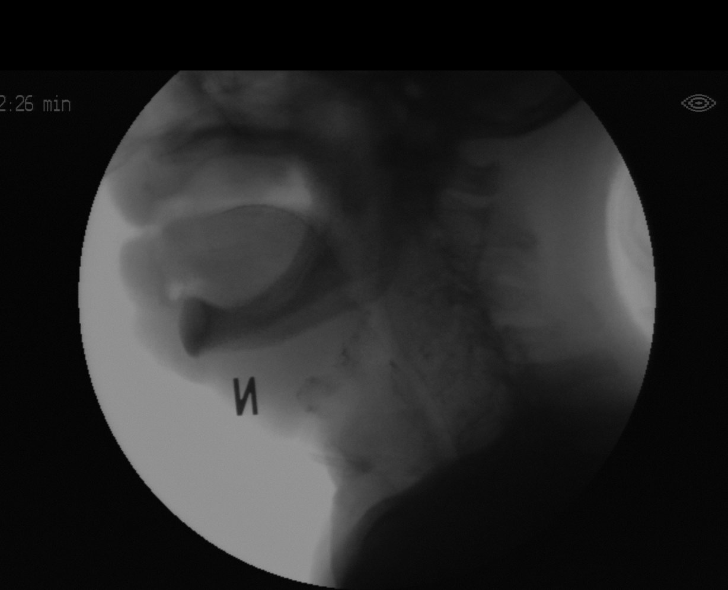

[Series 24: run · 1 of 153 frames shown (12 of 12)]
[frame 131/153]
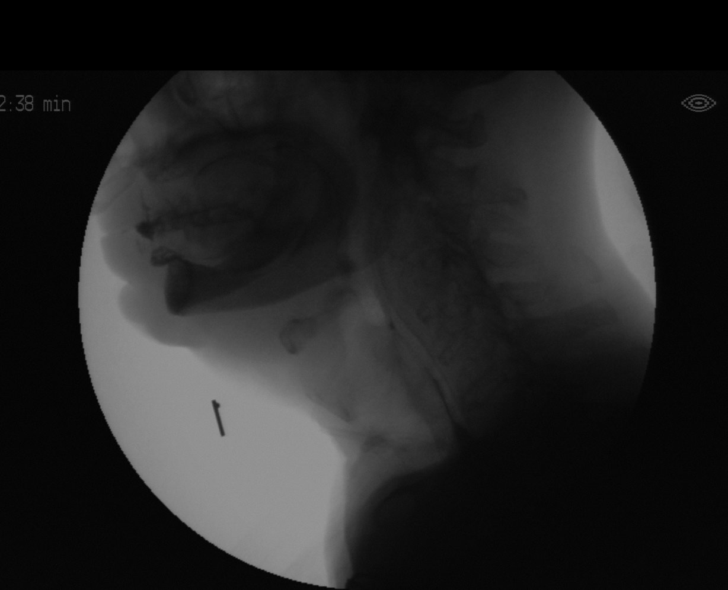

[12 of 24 positions shown; findings below may reference images not displayed]

FINDINGS: Multilevel spinal degenerative changes in the cervical spine noted
on lateral view.

Swallowing was performed across multiple consistencies, thin liquid,
nectar thick, applesauce, honey thickened liquids and serial bar.

Moderate silent aspiration was demonstrated on thin and nectar thick
liquids with penetration identified with applesauce consistency.
There was some clearing with cough after silent aspiration of
events.

Various maneuvers were performed a chin tuck, head turning and
glottic closure with some improvement, see speech pathology report
for full recommendations and details.
IMPRESSION: Moderate silent aspiration is demonstrated upon swallowing thin and
nectar thick liquids.

Spinal degenerative changes at multiple levels in the cervical spine
not well assessed on limited fluoroscopic images.

Please refer to the Speech Pathologists report for complete details
and recommendations.

## 2023-08-03 DEATH — deceased
